# Patient Record
Sex: Male | Born: 1995 | Hispanic: Yes | Smoking: Former smoker
Health system: Southern US, Community
[De-identification: ages and names within clinical notes are randomized; demographics above are authoritative.]

## PROBLEM LIST (undated history)

## (undated) DIAGNOSIS — T7840XA Allergy, unspecified, initial encounter: Secondary | ICD-10-CM

## (undated) DIAGNOSIS — J45909 Unspecified asthma, uncomplicated: Secondary | ICD-10-CM

## (undated) DIAGNOSIS — F419 Anxiety disorder, unspecified: Secondary | ICD-10-CM

## (undated) DIAGNOSIS — R011 Cardiac murmur, unspecified: Secondary | ICD-10-CM

## (undated) DIAGNOSIS — F1910 Other psychoactive substance abuse, uncomplicated: Secondary | ICD-10-CM

## (undated) HISTORY — DX: Allergy, unspecified, initial encounter: T78.40XA

## (undated) HISTORY — DX: Cardiac murmur, unspecified: R01.1

---
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
## 2017-04-09 ENCOUNTER — Emergency Department
Admission: EM | Admit: 2017-04-09 | Discharge: 2017-04-09 | Disposition: A | Discharge status: Home / Self Care | Payer: Worker's Compensation | Attending: Emergency Medicine | Admitting: Emergency Medicine

## 2017-04-09 ENCOUNTER — Encounter: Payer: Self-pay | Admitting: Emergency Medicine

## 2017-04-09 ENCOUNTER — Emergency Department: Payer: Worker's Compensation

## 2017-04-09 ENCOUNTER — Other Ambulatory Visit: Payer: Self-pay

## 2017-04-09 DIAGNOSIS — Y990 Civilian activity done for income or pay: Secondary | ICD-10-CM | POA: Insufficient documentation

## 2017-04-09 DIAGNOSIS — Z79899 Other long term (current) drug therapy: Secondary | ICD-10-CM | POA: Insufficient documentation

## 2017-04-09 DIAGNOSIS — Y9259 Other trade areas as the place of occurrence of the external cause: Secondary | ICD-10-CM | POA: Insufficient documentation

## 2017-04-09 DIAGNOSIS — S61311A Laceration without foreign body of left index finger with damage to nail, initial encounter: Secondary | ICD-10-CM

## 2017-04-09 DIAGNOSIS — Y9389 Activity, other specified: Secondary | ICD-10-CM | POA: Insufficient documentation

## 2017-04-09 DIAGNOSIS — W260XXA Contact with knife, initial encounter: Secondary | ICD-10-CM | POA: Insufficient documentation

## 2017-04-09 DIAGNOSIS — F17210 Nicotine dependence, cigarettes, uncomplicated: Secondary | ICD-10-CM | POA: Insufficient documentation

## 2017-04-09 MED ORDER — LIDOCAINE HCL (PF) 1 % IJ SOLN
5.0000 mL | Freq: Once | INTRAMUSCULAR | Status: DC
  Filled 2017-04-09: qty 5

## 2017-04-09 MED ORDER — BACITRACIN ZINC 500 UNIT/GM EX OINT
1.0000 "application " | TOPICAL_OINTMENT | Freq: Once | CUTANEOUS | Status: DC
  Filled 2017-04-09: qty 0.9

## 2017-04-09 NOTE — ED Triage Notes (Signed)
Worker's comp - lac to left 2nd digit

## 2017-04-09 NOTE — Discharge Instructions (Signed)
Do not get the sutured area wet for 24 hours. After 24 hours, shower/bathe as usual and pat the area dry. Change the bandage 2 times per day and apply antibiotic ointment. Leave open to air when at no risk of getting the area dirty, but cover at night before bed.   

## 2017-04-09 NOTE — ED Provider Notes (Signed)
[facility] Emergency Department Provider Note  ____________________________________________  Time seen: Approximately 5:00 PM  I have reviewed the triage vital signs and the nursing notes.   HISTORY  Chief Complaint Extremity Laceration   HPI Brent Bowen is a 22 y.o. male who presents to the emergency department for evaluation and treatment of laceration to the left index finger that he sustained while at work.  Patient states that he was cutting a box of partially and the knife slipped.  He had recently sharp and the knife.  Tetanus vaccination is up-to-date.  Bleeding is well controlled with light pressure at this time.  No initial alleviating measures have been attempted for this complaint.  No past medical history on file.  There are no active problems to display for this patient.   No past surgical history on file.  Prior to Admission medications   Medication Sig Start Date End Date Taking? Authorizing Provider  albuterol (PROVENTIL HFA;VENTOLIN HFA) 108 (90 Base) MCG/ACT inhaler Inhale 2 puffs into the lungs every 6 (six) hours as needed for wheezing or shortness of breath. 11/29/15   Brent Bowen, Brent Eisenmenger B, FNP  amoxicillin-clavulanate (AUGMENTIN) 875-125 MG tablet Take 1 tablet by mouth 2 (two) times daily. 09/07/15   Hagler, Jami L, PA-C  guaiFENesin-codeine (ROBITUSSIN AC) 100-10 MG/5ML syrup Take 10 mLs by mouth 3 (three) times daily as needed for cough. 11/29/15   Brent Korf B, FNP  predniSONE (DELTASONE) 10 MG tablet Take 5 tablets (50 mg total) by mouth daily. 11/29/15   Brent Pester, FNP    Allergies Patient has no known allergies.  No family history on file.  Social History Social History   Tobacco Use  . Smoking status: Current Some Day Smoker    Packs/day: 0.15    Types: Cigarettes  . Smokeless tobacco: Never Used  Substance Use Topics  . Alcohol use: No  . Drug use: No    Review of Systems  Constitutional:  Negative for fever. Respiratory: Negative for cough or shortness of breath.  Musculoskeletal: Positive for myalgias Skin: Positive for laceration to the left index finger Neurological: Negative for numbness or paresthesias. ____________________________________________   PHYSICAL EXAM:  VITAL SIGNS: ED Triage Vitals  Enc Vitals Group     BP 04/09/17 1432 (!) 147/87     Pulse Rate 04/09/17 1432 72     Resp 04/09/17 1432 16     Temp 04/09/17 1432 98.4 F (36.9 C)     Temp Source 04/09/17 1432 Oral     SpO2 04/09/17 1432 100 %     Weight 04/09/17 1430 220 lb (99.8 kg)     Height 04/09/17 1430 6' (1.829 m)     Head Circumference --      Peak Flow --      Pain Score 04/09/17 1430 3     Pain Loc --      Pain Edu? --      Excl. in GC? --      Constitutional: Well appearing. Eyes: Conjunctivae are clear without discharge or drainage. Nose: No rhinorrhea noted. Mouth/Throat: Airway is patent.  Neck: No stridor. Unrestricted range of motion observed. Lymphatic: N/A Cardiovascular: Capillary refill is <3 seconds.  Respiratory: Respirations are even and unlabored.. Musculoskeletal: Unrestricted range of motion observed. Neurologic: Awake, alert, and oriented x 4.  Skin: 2 cm flap laceration noted to the medial aspect of the left index finger that crosses the upper edge of the nail.   ____________________________________________  LABS (all labs ordered are listed, but only abnormal results are displayed)  Labs Reviewed - No data to display ____________________________________________  EKG  Not indicated ____________________________________________  RADIOLOGY  Image of the left index finger reveals no acute bony abnormality and no metallic or radiopaque foreign body is identified. I, Brent Bowen, personally viewed and evaluated these images (plain radiographs) as part of my medical decision making, as well as reviewing the written report by the  radiologist.  ____________________________________________   PROCEDURES  .Marland Kitchen.Laceration Repair Date/Time: 04/09/2017 5:06 PM Performed by: Brent Bowen, Brent Massie B, FNP Authorized by: Brent Bowen, Brent Uher B, FNP   Consent:    Consent obtained:  Verbal   Consent given by:  Patient   Risks discussed:  Infection, pain and poor wound healing Anesthesia (see MAR for exact dosages):    Anesthesia method:  Nerve block   Block needle gauge:  25 G   Block anesthetic:  Lidocaine 1% w/o epi   Block technique:  Digital   Block injection procedure:  Anatomic landmarks identified and introduced needle   Block outcome:  Anesthesia achieved Laceration details:    Location:  Finger   Finger location:  Bowen index finger   Length (cm):  2 Repair type:    Repair type:  Simple Exploration:    Hemostasis achieved with:  Direct pressure   Wound exploration: entire depth of wound probed and visualized     Contaminated: no   Treatment:    Area cleansed with:  Saline and Betadine   Amount of cleaning:  Standard   Irrigation solution:  Sterile saline   Irrigation method:  Syringe   Visualized foreign bodies/material removed: yes   Skin repair:    Repair method:  Sutures   Suture size:  5-0   Suture technique:  Simple interrupted   Number of sutures:  6 Approximation:    Approximation:  Close   Vermilion border: well-aligned   Post-procedure details:    Dressing:  Sterile dressing and antibiotic ointment   Patient tolerance of procedure:  Tolerated well, no immediate complications Comments:     Small bits of parsley removed   ____________________________________________   INITIAL IMPRESSION / ASSESSMENT AND PLAN / ED COURSE  Brent Bowen is a 22 y.o. male who presents to the emergency department for evaluation and treatment of a laceration to the left index finger.  Sutures were inserted after digital block was completed. He was instructed to have the sutures removed in 10 days. Wound care  was also discussed. He is to return to the ER for symptoms that change or worsen or for new concerns if unable to schedule an appointment.  Medications  lidocaine (PF) (XYLOCAINE) 1 % injection 5 mL (not administered)  bacitracin ointment 1 application (not administered)     Pertinent labs & imaging results that were available during my care of the patient were reviewed by me and considered in my medical decision making (see chart for details). ____________________________________________   FINAL CLINICAL IMPRESSION(S) / ED DIAGNOSES  Final diagnoses:  Laceration of left index finger without foreign body with damage to nail, initial encounter    ED Discharge Orders    None       Note:  This document was prepared using Dragon voice recognition software and may include unintentional dictation errors.    Brent Bowen, Yevonne Yokum B, FNP 04/09/17 1709    Sharyn CreamerQuale, Mark, MD 04/09/17 2047

## 2017-04-09 NOTE — ED Notes (Signed)
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 Employee aware.

## 2017-07-24 ENCOUNTER — Other Ambulatory Visit: Payer: Self-pay | Admitting: Obstetrics and Gynecology

## 2017-07-24 DIAGNOSIS — Z31440 Encounter of male for testing for genetic disease carrier status for procreative management: Secondary | ICD-10-CM

## 2017-08-01 LAB — CYSTIC FIBROSIS MUTATION 97: Interpretation: NOT DETECTED

## 2017-10-29 IMAGING — DX DG FINGER MIDDLE 2+V*L*
3 series · 3 of 3 positions shown · non-contrast
Comparison: None.

CLINICAL DATA: 19-year-old male with injury to the left middle
finger.

EXAM:
LEFT MIDDLE FINGER 2+V

[finger ap]
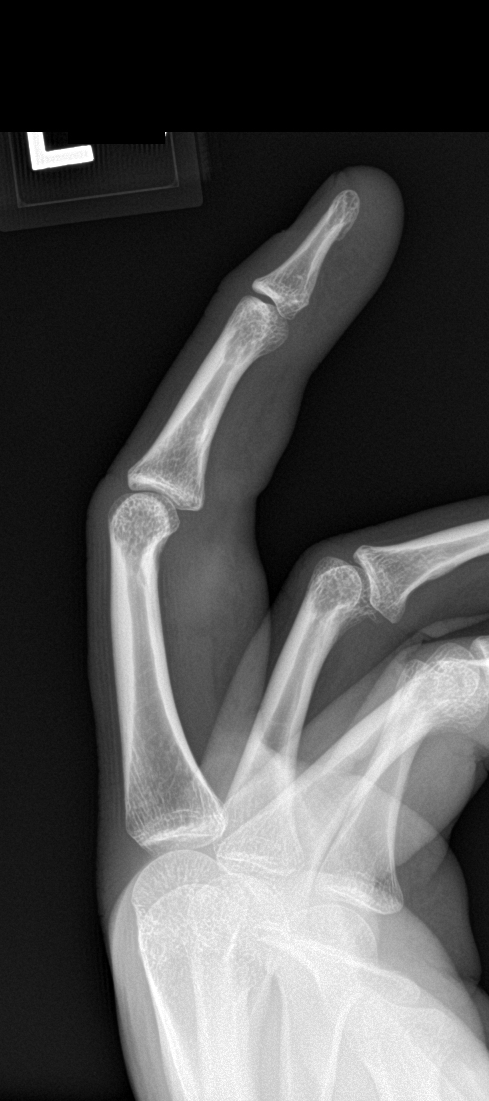

[finger obl]
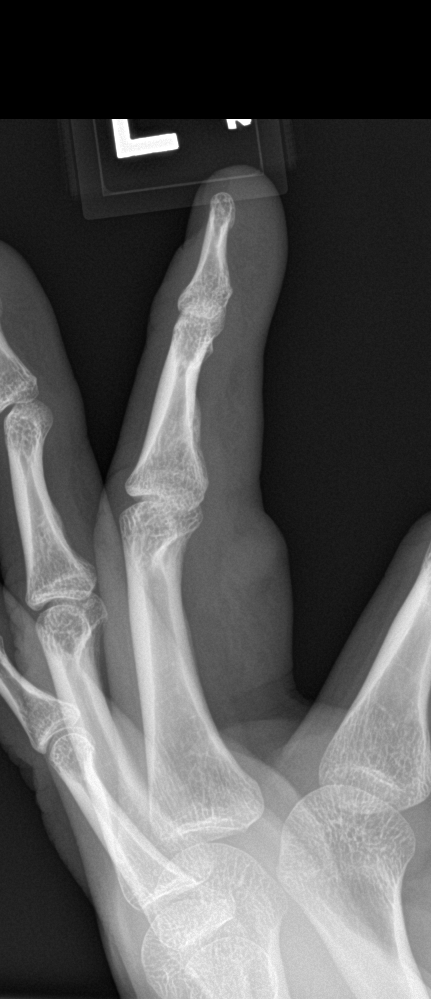

[finger lat]
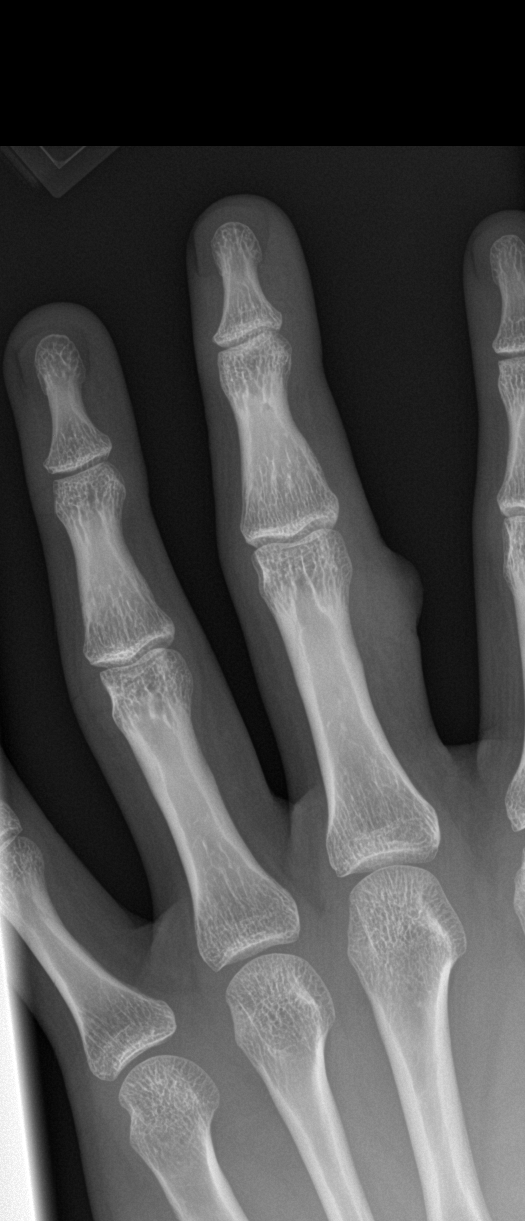

[3 of 3 positions shown; findings below may reference images not displayed]

FINDINGS: There is no acute fracture or dislocation. The bones are well
mineralized. There is diffuse soft tissue swelling of the middle
finger. A focal in soft tissue nodularity along the radial aspect of
the proximal phalanx of the middle finger may represent a focal area
skin laceration or infection/inflammation. Clinical correlation is
recommended. No radiopaque foreign object or soft tissue gas.
IMPRESSION: No acute/ traumatic osseous pathology.

Diffuse soft tissue swelling of the middle finger. Clinical
correlation is recommended.

## 2020-12-26 ENCOUNTER — Emergency Department
Admission: EM | Admit: 2020-12-26 | Discharge: 2020-12-26 | Disposition: A | Discharge status: Home / Self Care | Payer: Self-pay | Attending: Emergency Medicine | Admitting: Emergency Medicine

## 2020-12-26 ENCOUNTER — Emergency Department: Payer: Self-pay

## 2020-12-26 ENCOUNTER — Other Ambulatory Visit: Payer: Self-pay

## 2020-12-26 ENCOUNTER — Encounter: Payer: Self-pay | Admitting: Emergency Medicine

## 2020-12-26 DIAGNOSIS — J209 Acute bronchitis, unspecified: Secondary | ICD-10-CM | POA: Insufficient documentation

## 2020-12-26 DIAGNOSIS — Z20822 Contact with and (suspected) exposure to covid-19: Secondary | ICD-10-CM | POA: Insufficient documentation

## 2020-12-26 DIAGNOSIS — F17210 Nicotine dependence, cigarettes, uncomplicated: Secondary | ICD-10-CM | POA: Insufficient documentation

## 2020-12-26 DIAGNOSIS — J40 Bronchitis, not specified as acute or chronic: Secondary | ICD-10-CM

## 2020-12-26 LAB — CBC WITH DIFFERENTIAL/PLATELET
Abs Immature Granulocytes: 0.08 10*3/uL — ABNORMAL HIGH (ref 0.00–0.07)
Basophils Absolute: 0.1 10*3/uL (ref 0.0–0.1)
Basophils Relative: 1 %
Eosinophils Absolute: 0.9 10*3/uL — ABNORMAL HIGH (ref 0.0–0.5)
Eosinophils Relative: 9 %
HCT: 49 % (ref 39.0–52.0)
Hemoglobin: 16.5 g/dL (ref 13.0–17.0)
Immature Granulocytes: 1 %
Lymphocytes Relative: 33 %
Lymphs Abs: 3.1 10*3/uL (ref 0.7–4.0)
MCH: 28.6 pg (ref 26.0–34.0)
MCHC: 33.7 g/dL (ref 30.0–36.0)
MCV: 85.1 fL (ref 80.0–100.0)
Monocytes Absolute: 0.9 10*3/uL (ref 0.1–1.0)
Monocytes Relative: 9 %
Neutro Abs: 4.5 10*3/uL (ref 1.7–7.7)
Neutrophils Relative %: 47 %
Platelets: 340 10*3/uL (ref 150–400)
RBC: 5.76 MIL/uL (ref 4.22–5.81)
RDW: 12.6 % (ref 11.5–15.5)
WBC: 9.5 10*3/uL (ref 4.0–10.5)
nRBC: 0 % (ref 0.0–0.2)

## 2020-12-26 LAB — COMPREHENSIVE METABOLIC PANEL
ALT: 31 U/L (ref 0–44)
AST: 22 U/L (ref 15–41)
Albumin: 4.5 g/dL (ref 3.5–5.0)
Alkaline Phosphatase: 71 U/L (ref 38–126)
Anion gap: 9 (ref 5–15)
BUN: 12 mg/dL (ref 6–20)
CO2: 25 mmol/L (ref 22–32)
Calcium: 9.7 mg/dL (ref 8.9–10.3)
Chloride: 108 mmol/L (ref 98–111)
Creatinine, Ser: 1.02 mg/dL (ref 0.61–1.24)
GFR, Estimated: >60 mL/min (ref >60)
Glucose, Bld: 109 mg/dL — ABNORMAL HIGH (ref 70–99)
Potassium: 3.6 mmol/L (ref 3.5–5.1)
Sodium: 142 mmol/L (ref 135–145)
Total Bilirubin: 0.9 mg/dL (ref 0.3–1.2)
Total Protein: 8.1 g/dL (ref 6.5–8.1)

## 2020-12-26 LAB — RESP PANEL BY RT-PCR (FLU A&B, COVID) ARPGX2
Influenza A by PCR: NEGATIVE
Influenza B by PCR: NEGATIVE
SARS Coronavirus 2 by RT PCR: NEGATIVE

## 2020-12-26 LAB — D-DIMER, QUANTITATIVE: D-Dimer, Quant: 0.34 ug/mL-FEU (ref 0.00–0.50)

## 2020-12-26 MED ORDER — ALBUTEROL SULFATE HFA 108 (90 BASE) MCG/ACT IN AERS: 2.0000 | INHALATION_SPRAY | Freq: Four times a day (QID) | RESPIRATORY_TRACT | 2 refills | Status: DC | PRN

## 2020-12-26 MED ORDER — AZITHROMYCIN 250 MG PO TABS
ORAL_TABLET | ORAL | 0 refills | Status: AC
Start: 2020-12-26 — End: 2020-12-31

## 2020-12-26 MED ORDER — IPRATROPIUM-ALBUTEROL 0.5-2.5 (3) MG/3ML IN SOLN
3.0000 mL | Freq: Once | RESPIRATORY_TRACT | Status: AC
  Administered 2020-12-26: 3 mL via RESPIRATORY_TRACT
  Filled 2020-12-26: qty 3

## 2020-12-26 MED ORDER — ALBUTEROL SULFATE HFA 108 (90 BASE) MCG/ACT IN AERS
2.0000 | INHALATION_SPRAY | RESPIRATORY_TRACT | Status: DC | PRN
  Filled 2020-12-26 (×2): qty 6.7

## 2020-12-26 NOTE — Discharge Instructions (Addendum)
Take the Zithromax antibiotic as directed and use the inhaler 2 puffs 4 times a day as needed.  Please return if you are worse or no better in 3 or 4 days.  You can also follow-up with your regular doctor.

## 2020-12-26 NOTE — ED Provider Notes (Signed)
[facility] Emergency Department Provider Note   ____________________________________________   Event Date/Time   First MD Initiated Contact with Patient 12/26/20 0813     (approximate)  I have reviewed the triage vital signs and the nursing notes.   HISTORY  Chief Complaint Cough   HPI Brent Bowen is a 25 y.o. male with a cough and feeling short of breath for about a week. Cough produces little bit of mucus.  It did not look at the color.  He feels like he cannot get a deep breath.  He is not running a fever.  He had something like this in 2017 when he was diagnosed with bronchitis and got an inhaler.  He does not have any history of asthma.  He is not having any pain or chest discomfort.      History reviewed. No pertinent past medical history.  There are no problems to display for this patient.   History reviewed. No pertinent surgical history.  Prior to Admission medications   Medication Sig Start Date End Date Taking? Authorizing Provider  albuterol (VENTOLIN HFA) 108 (90 Base) MCG/ACT inhaler Inhale 2 puffs into the lungs every 6 (six) hours as needed for wheezing or shortness of breath. 12/26/20  Yes Arnaldo Natal, MD  azithromycin (ZITHROMAX Z-PAK) 250 MG tablet Take 2 tablets (500 mg) on  Day 1,  followed by 1 tablet (250 mg) once daily on Days 2 through 5. 12/26/20 12/31/20 Yes Arnaldo Natal, MD  albuterol (PROVENTIL HFA;VENTOLIN HFA) 108 (90 Base) MCG/ACT inhaler Inhale 2 puffs into the lungs every 6 (six) hours as needed for wheezing or shortness of breath. Patient not taking: Reported on 12/26/2020 11/29/15   Kem Boroughs B, FNP  amoxicillin-clavulanate (AUGMENTIN) 875-125 MG tablet Take 1 tablet by mouth 2 (two) times daily. Patient not taking: Reported on 12/26/2020 09/07/15   Hagler, Jami L, PA-C  guaiFENesin-codeine (ROBITUSSIN AC) 100-10 MG/5ML syrup Take 10 mLs by mouth 3 (three) times daily as needed for  cough. Patient not taking: Reported on 12/26/2020 11/29/15   Kem Boroughs B, FNP  predniSONE (DELTASONE) 10 MG tablet Take 5 tablets (50 mg total) by mouth daily. Patient not taking: Reported on 12/26/2020 11/29/15   Chinita Pester, FNP    Allergies Patient has no known allergies.  No family history on file.  Social History Social History   Tobacco Use   Smoking status: Some Days    Packs/day: 0.15    Types: Cigarettes   Smokeless tobacco: Never  Substance Use Topics   Alcohol use: No   Drug use: No    Review of Systems  Constitutional: No fever/he does complain of chills Eyes: No visual changes. ENT: No sore throat. Cardiovascular: Denies chest pain. Respiratory: shortness of breath. Gastrointestinal: No abdominal pain.  No nausea, no vomiting.  No diarrhea.  No constipation. Genitourinary: Negative for dysuria. Musculoskeletal: Negative for back pain. Skin: Negative for rash. Neurological: Negative for headaches, focal weakness   ____________________________________________   PHYSICAL EXAM:  VITAL SIGNS: ED Triage Vitals  Enc Vitals Group     BP 12/26/20 0756 138/90     Pulse Rate 12/26/20 0756 (!) 113     Resp 12/26/20 0756 (!) 22     Temp 12/26/20 0756 98.4 F (36.9 C)     Temp Source 12/26/20 0756 Oral     SpO2 12/26/20 0756 93 %     Weight 12/26/20 0756 230 lb (104.3 kg)  Height 12/26/20 0756 6' (1.829 m)     Head Circumference --      Peak Flow --      Pain Score 12/26/20 0759 8     Pain Loc --      Pain Edu? --      Excl. in GC? --     Constitutional: Alert and oriented. Well appearing and in no acute distress. Eyes: Conjunctivae are normal.  Head: Atraumatic. Nose: No congestion/rhinnorhea. Mouth/Throat: Mucous membranes are moist.  Oropharynx non-erythematous. Neck: No stridor.  Cardiovascular: Normal rate, regular rhythm. Grossly normal heart sounds.  Good peripheral circulation. Respiratory: Normal respiratory effort.  No  retractions. Lungs diffuse wheezing Gastrointestinal: Soft and nontender. No distention. No abdominal bruits.  Musculoskeletal: No lower extremity tenderness nor edema.   Neurologic:  Normal speech and language. No gross focal neurologic deficits are appreciated.  Skin:  Skin is warm, dry and intact. No rash noted.   ____________________________________________   LABS (all labs ordered are listed, but only abnormal results are displayed)  Labs Reviewed  CBC WITH DIFFERENTIAL/PLATELET - Abnormal; Notable for the following components:      Result Value   Eosinophils Absolute 0.9 (*)    Abs Immature Granulocytes 0.08 (*)    All other components within normal limits  COMPREHENSIVE METABOLIC PANEL - Abnormal; Notable for the following components:   Glucose, Bld 109 (*)    All other components within normal limits  RESP PANEL BY RT-PCR (FLU A&B, COVID) [id number]  D-DIMER, QUANTITATIVE   ____________________________________________  EKG  EKG read interpreted by me shows slight right axis ____________________________________________  RADIOLOGY Jill Poling, personally viewed and evaluated these images (plain radiographs) as part of my medical decision making, as well as reviewing the written report by the radiologist.  ED MD interpretation:    Official radiology report(s): DG Chest 2 View  Result Date: 12/26/2020 CLINICAL DATA:  25 year old male with cough. Chest tightness and chills for 1 week. Smoker. EXAM: CHEST - 2 VIEW COMPARISON:  Chest radiographs 11/29/2015. FINDINGS: Lung volumes and mediastinal contours remain normal. Visualized tracheal air column is within normal limits. Stable lung markings since 2017, mild asymmetrically increased in the right upper lobe. No pneumothorax, pulmonary edema, pleural effusion, confluent or acute pulmonary opacity. No acute osseous abnormality identified. Negative visible bowel gas. IMPRESSION: No acute cardiopulmonary abnormality.  Electronically Signed   By: Odessa Fleming M.D.   On: 12/26/2020 09:15    ____________________________________________   PROCEDURES  Procedure(s) performed (including Critical Care):  Procedures   ____________________________________________   INITIAL IMPRESSION / ASSESSMENT AND PLAN / ED COURSE    ----------------------------------------- 12:04 PM on 12/26/2020 ----------------------------------------- Patient feels much better.  Lung wheezing is stopped he still has a few scattered crackles.  O2 sats are 97 on room air.  I will discharge him.  Sounds like he has bronchitis.  There is no sign of DVT PE pneumonia etc.  COVID is negative.          ____________________________________________   FINAL CLINICAL IMPRESSION(S) / ED DIAGNOSES  Final diagnoses:  Bronchitis     ED Discharge Orders          Ordered    azithromycin (ZITHROMAX Z-PAK) 250 MG tablet        12/26/20 1204    albuterol (VENTOLIN HFA) 108 (90 Base) MCG/ACT inhaler  Every 6 hours PRN        12/26/20 1204  Note:  This document was prepared using Dragon voice recognition software and may include unintentional dictation errors.    Arnaldo Natal, MD 12/26/20 [phone]

## 2020-12-26 NOTE — ED Triage Notes (Addendum)
C/O cough, chills x 1 week.  Symptoms not improving.  States feels like he cannot get a full breath.    AAOx3.  Skin pale.  Cough present.  No SOB/ DOE., tachypnea noted.  Voice clear and strong.

## 2021-05-14 DIAGNOSIS — F411 Generalized anxiety disorder: Secondary | ICD-10-CM | POA: Insufficient documentation

## 2021-05-14 DIAGNOSIS — J4520 Mild intermittent asthma, uncomplicated: Secondary | ICD-10-CM | POA: Insufficient documentation

## 2021-05-14 DIAGNOSIS — R030 Elevated blood-pressure reading, without diagnosis of hypertension: Secondary | ICD-10-CM | POA: Diagnosis present

## 2021-05-24 DIAGNOSIS — R7303 Prediabetes: Secondary | ICD-10-CM | POA: Diagnosis present

## 2021-12-01 ENCOUNTER — Ambulatory Visit
Admission: EM | Admit: 2021-12-01 | Discharge: 2021-12-01 | Disposition: A | Discharge status: Home / Self Care | Payer: BC Managed Care – PPO | Attending: Emergency Medicine | Admitting: Emergency Medicine

## 2021-12-01 ENCOUNTER — Encounter: Payer: Self-pay | Admitting: Emergency Medicine

## 2021-12-01 ENCOUNTER — Ambulatory Visit (INDEPENDENT_AMBULATORY_CARE_PROVIDER_SITE_OTHER): Payer: BC Managed Care – PPO

## 2021-12-01 DIAGNOSIS — N50812 Left testicular pain: Secondary | ICD-10-CM

## 2021-12-01 DIAGNOSIS — S3994XA Unspecified injury of external genitals, initial encounter: Secondary | ICD-10-CM | POA: Diagnosis not present

## 2021-12-01 DIAGNOSIS — N5089 Other specified disorders of the male genital organs: Secondary | ICD-10-CM

## 2021-12-01 DIAGNOSIS — N433 Hydrocele, unspecified: Secondary | ICD-10-CM

## 2021-12-01 HISTORY — DX: Anxiety disorder, unspecified: F41.9

## 2021-12-01 MED ORDER — KETOROLAC TROMETHAMINE 10 MG PO TABS: 10.0000 mg | ORAL_TABLET | Freq: Four times a day (QID) | ORAL | 0 refills | Status: DC | PRN

## 2021-12-01 MED ORDER — KETOROLAC TROMETHAMINE 60 MG/2ML IM SOLN
30.0000 mg | Freq: Once | INTRAMUSCULAR | Status: AC
  Administered 2021-12-01: 30 mg via INTRAMUSCULAR

## 2021-12-01 NOTE — Discharge Instructions (Addendum)
Your ultrasound today did not demonstrate any damage to your testicle or disruption of blood flow.  It did demonstrate that there is some fluid surrounding your testicle in your scrotum that is most likely the result of the trauma you sustained.  You need to wear supportive underwear to take pressure off of your scrotum.  You may also wear an athletic supporter (jockstrap) to achieve the same effect.  Ice your scrotum for 20 minutes at a time 2-3 times a day to help with pain and inflammation.  Make sure that there is a cloth between your skin and the ice so as not to cause frostbite or skin damage.  I am going to give you a prescription for Toradol that you can use for pain.  You may also supplement with over-the-counter Tylenol.  I am referring you to [facility] urology so they can monitor the resolution of the fluid in your scrotum.  If you have any increase in swelling, pain, fever, or blood in your urine you need to go to the ER for evaluation.
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
## 2021-12-14 ENCOUNTER — Ambulatory Visit: Payer: BC Managed Care – PPO | Admitting: Urology

## 2021-12-16 ENCOUNTER — Encounter: Payer: Self-pay | Admitting: Urology

## 2023-01-31 ENCOUNTER — Other Ambulatory Visit: Payer: Self-pay

## 2023-01-31 ENCOUNTER — Emergency Department
Admission: EM | Admit: 2023-01-31 | Discharge: 2023-02-01 | Disposition: A | Discharge status: Home / Self Care | Payer: BC Managed Care – PPO | Attending: Emergency Medicine | Admitting: Emergency Medicine

## 2023-01-31 ENCOUNTER — Emergency Department: Payer: BC Managed Care – PPO

## 2023-01-31 DIAGNOSIS — Z1152 Encounter for screening for COVID-19: Secondary | ICD-10-CM | POA: Diagnosis not present

## 2023-01-31 DIAGNOSIS — R0602 Shortness of breath: Secondary | ICD-10-CM

## 2023-01-31 DIAGNOSIS — J40 Bronchitis, not specified as acute or chronic: Secondary | ICD-10-CM | POA: Diagnosis not present

## 2023-01-31 LAB — RESP PANEL BY RT-PCR (RSV, FLU A&B, COVID)  RVPGX2
Influenza A by PCR: NEGATIVE
Influenza B by PCR: NEGATIVE
Resp Syncytial Virus by PCR: NEGATIVE
SARS Coronavirus 2 by RT PCR: NEGATIVE

## 2023-01-31 LAB — COMPREHENSIVE METABOLIC PANEL
ALT: 38 U/L (ref 0–44)
AST: 23 U/L (ref 15–41)
Albumin: 4.6 g/dL (ref 3.5–5.0)
Alkaline Phosphatase: 61 U/L (ref 38–126)
Anion gap: 8 (ref 5–15)
BUN: 9 mg/dL (ref 6–20)
CO2: 21 mmol/L — ABNORMAL LOW (ref 22–32)
Calcium: 9 mg/dL (ref 8.9–10.3)
Chloride: 107 mmol/L (ref 98–111)
Creatinine, Ser: 0.95 mg/dL (ref 0.61–1.24)
GFR, Estimated: >60 mL/min (ref >60)
Glucose, Bld: 104 mg/dL — ABNORMAL HIGH (ref 70–99)
Potassium: 3.2 mmol/L — ABNORMAL LOW (ref 3.5–5.1)
Sodium: 136 mmol/L (ref 135–145)
Total Bilirubin: 0.5 mg/dL (ref <1.2)
Total Protein: 7.7 g/dL (ref 6.5–8.1)

## 2023-01-31 LAB — CBC
HCT: 43.3 % (ref 39.0–52.0)
Hemoglobin: 14.7 g/dL (ref 13.0–17.0)
MCH: 28.8 pg (ref 26.0–34.0)
MCHC: 33.9 g/dL (ref 30.0–36.0)
MCV: 84.9 fL (ref 80.0–100.0)
Platelets: 361 10*3/uL (ref 150–400)
RBC: 5.1 MIL/uL (ref 4.22–5.81)
RDW: 12.2 % (ref 11.5–15.5)
WBC: 13.4 10*3/uL — ABNORMAL HIGH (ref 4.0–10.5)
nRBC: 0 % (ref 0.0–0.2)

## 2023-01-31 LAB — TROPONIN I (HIGH SENSITIVITY): Troponin I (High Sensitivity): 2 ng/L (ref <18)

## 2023-01-31 NOTE — ED Triage Notes (Signed)
Pt presents to ER with c/o SOB and chest tightness that started this morning.  Pt reports hx of asthma, but does not have inhaler at home.  Pt reports he got a Primatene inhaler at a pharmacy, and states his sx were worse after that.  Pt also states his throat feels scratchy at this time.  Pt is otherwise A&O x4 and in NAD at this time.

## 2023-01-31 NOTE — ED Provider Notes (Signed)
[facility] Provider Note    Event Date/Time   First MD Initiated Contact with Patient 01/31/23 2336     (approximate)   History   Shortness of Breath   HPI  Brent Bowen is a 27 y.o. male who presented to the emergency department today because of concerns for shortness of breath and chest pain.  Yesterday the patient first noticed some shortness of breath.  However when he woke up this morning it was more pronounced.  It was accompanied by sharp central chest pain.  Patient states that he had a similar episode a couple of years ago.  He did try getting an over the counter inhaler from [facility] which did not give any significant relief.  He denies any fevers or chills.     Physical Exam   Triage Vital Signs: ED Triage Vitals  Encounter Vitals Group     BP 01/31/23 2206 (!) 151/104     Systolic BP Percentile --      Diastolic BP Percentile --      Pulse Rate 01/31/23 2206 92     Resp 01/31/23 2206 (!) 22     Temp 01/31/23 2206 98.8 F (37.1 C)     Temp Source 01/31/23 2206 Oral     SpO2 01/31/23 2206 94 %     Weight 01/31/23 2204 235 lb (106.6 kg)     Height 01/31/23 2204 6\' 1"  (1.854 m)     Head Circumference --      Peak Flow --      Pain Score 01/31/23 2203 8     Pain Loc --      Pain Education --      Exclude from Growth Chart --     Most recent vital signs: Vitals:   01/31/23 2206  BP: (!) 151/104  Pulse: 92  Resp: (!) 22  Temp: 98.8 F (37.1 C)  SpO2: 94%   General: Awake, alert, oriented. CV:  Good peripheral perfusion. Regular rate and rhythm. Resp:  Normal effort. Diffuse expiratory wheezing. Abd:  No distention.   ED Results / Procedures / Treatments   Labs (all labs ordered are listed, but only abnormal results are displayed) Labs Reviewed  CBC - Abnormal; Notable for the following components:      Result Value   WBC 13.4 (*)    All other components within normal limits  COMPREHENSIVE METABOLIC PANEL  - Abnormal; Notable for the following components:   Potassium 3.2 (*)    CO2 21 (*)    Glucose, Bld 104 (*)    All other components within normal limits  RESP PANEL BY RT-PCR (RSV, FLU A&B, COVID)  RVPGX2  TROPONIN I (HIGH SENSITIVITY)  TROPONIN I (HIGH SENSITIVITY)     EKG  I, Phineas Semen, attending physician, personally viewed and interpreted this EKG  EKG Time: 2203 Rate: 93 Rhythm: normal sinus rhythm Axis: normal Intervals: qtc 427 QRS: narrow ST changes: no st elevation Impression: normal ekg   RADIOLOGY I independently interpreted and visualized the CXR. My interpretation: No pneumina Radiology interpretation:  IMPRESSION:  No active cardiopulmonary disease.      PROCEDURES:  Critical Care performed: No   MEDICATIONS ORDERED IN ED: Medications - No data to display   IMPRESSION / MDM / ASSESSMENT AND PLAN / ED COURSE  I reviewed the triage vital signs and the nursing notes.  Differential diagnosis includes, but is not limited to, pneumonia, bronchitis, viral illness  Patient's presentation is most consistent with acute presentation with potential threat to life or bodily function.  Patient presented to the emergency department today because of concerns for shortness of breath and chest pain.  EKG without concerning abnormalities.  Chest x-ray without pneumonia or pneumothorax.  On exam patient has diffuse expiratory wheezing.  Will try giving steroids and breathing treatment here in the emergency department.  If patient feels improvement I do think would be reasonable to discharge with prescription for further steroids and inhaler.     FINAL CLINICAL IMPRESSION(S) / ED DIAGNOSES   Final diagnoses:  Bronchitis  SOB (shortness of breath)    Note:  This document was prepared using Dragon voice recognition software and may include unintentional dictation errors.    Phineas Semen, MD 02/01/23 [phone]

## 2023-01-31 NOTE — ED Provider Notes (Incomplete)
[facility] Provider Note    Event Date/Time   First MD Initiated Contact with Patient 01/31/23 2336     (approximate)   History   Shortness of Breath   HPI {Remember to add pertinent medical, surgical, social, and/or OB history to HPI:1} Brent Bowen is a 27 y.o. male  ***       Physical Exam   Triage Vital Signs: ED Triage Vitals  Encounter Vitals Group     BP 01/31/23 2206 (!) 151/104     Systolic BP Percentile --      Diastolic BP Percentile --      Pulse Rate 01/31/23 2206 92     Resp 01/31/23 2206 (!) 22     Temp 01/31/23 2206 98.8 F (37.1 C)     Temp Source 01/31/23 2206 Oral     SpO2 01/31/23 2206 94 %     Weight 01/31/23 2204 235 lb (106.6 kg)     Height 01/31/23 2204 6\' 1"  (1.854 m)     Head Circumference --      Peak Flow --      Pain Score 01/31/23 2203 8     Pain Loc --      Pain Education --      Exclude from Growth Chart --     Most recent vital signs: Vitals:   01/31/23 2206  BP: (!) 151/104  Pulse: 92  Resp: (!) 22  Temp: 98.8 F (37.1 C)  SpO2: 94%    {Only need to document appropriate and relevant physical exam:1} General: Awake, no distress. *** CV:  Good peripheral perfusion. *** Resp:  Normal effort. *** Abd:  No distention. *** Other:  ***   ED Results / Procedures / Treatments   Labs (all labs ordered are listed, but only abnormal results are displayed) Labs Reviewed  CBC - Abnormal; Notable for the following components:      Result Value   WBC 13.4 (*)    All other components within normal limits  COMPREHENSIVE METABOLIC PANEL - Abnormal; Notable for the following components:   Potassium 3.2 (*)    CO2 21 (*)    Glucose, Bld 104 (*)    All other components within normal limits  RESP PANEL BY RT-PCR (RSV, FLU A&B, COVID)  RVPGX2  TROPONIN I (HIGH SENSITIVITY)  TROPONIN I (HIGH SENSITIVITY)     EKG  I, Phineas Semen, attending physician, personally viewed and  interpreted this EKG  EKG Time: 2203 Rate: 93 Rhythm: normal sinus rhythm Axis: normal Intervals: qtc 427 QRS: narrow ST changes: no st elevation Impression: normal ekg   RADIOLOGY I independently interpreted and visualized the CXR. My interpretation: No pneumina Radiology interpretation:  IMPRESSION:  No active cardiopulmonary disease.      PROCEDURES:  Critical Care performed: Yes  CRITICAL CARE Performed by: Phineas Semen   Total critical care time: *** minutes  Critical care time was exclusive of separately billable procedures and treating other patients.  Critical care was necessary to treat or prevent imminent or life-threatening deterioration.  Critical care was time spent personally by me on the following activities: development of treatment plan with patient and/or surrogate as well as nursing, discussions with consultants, evaluation of patient's response to treatment, examination of patient, obtaining history from patient or surrogate, ordering and performing treatments and interventions, ordering and review of laboratory studies, ordering and review of radiographic studies, pulse oximetry and re-evaluation of patient's condition.   Procedures  MEDICATIONS ORDERED IN ED: Medications - No data to display   IMPRESSION / MDM / ASSESSMENT AND PLAN / ED COURSE  I reviewed the triage vital signs and the nursing notes.                              Differential diagnosis includes, but is not limited to, ***  Patient's presentation is most consistent with {EM COPA:27473}   ***The patient is on the cardiac monitor to evaluate for evidence of arrhythmia and/or significant heart rate changes.  ***      FINAL CLINICAL IMPRESSION(S) / ED DIAGNOSES   Final diagnoses:  None     Rx / DC Orders   ED Discharge Orders     None        Note:  This document was prepared using Dragon voice recognition software and may include unintentional  dictation errors.

## 2023-02-01 MED ORDER — ALBUTEROL SULFATE HFA 108 (90 BASE) MCG/ACT IN AERS: 2.0000 | INHALATION_SPRAY | Freq: Four times a day (QID) | RESPIRATORY_TRACT | 2 refills | Status: DC | PRN

## 2023-02-01 MED ORDER — PREDNISONE 20 MG PO TABS: 40.0000 mg | ORAL_TABLET | Freq: Every day | ORAL | 0 refills | Status: AC

## 2023-02-01 MED ORDER — PREDNISONE 20 MG PO TABS
40.0000 mg | ORAL_TABLET | Freq: Once | ORAL | Status: AC
  Administered 2023-02-01: 40 mg via ORAL
  Filled 2023-02-01: qty 2

## 2023-02-01 MED ORDER — IPRATROPIUM-ALBUTEROL 0.5-2.5 (3) MG/3ML IN SOLN
3.0000 mL | Freq: Once | RESPIRATORY_TRACT | Status: AC
  Administered 2023-02-01: 3 mL via RESPIRATORY_TRACT
  Filled 2023-02-01: qty 3

## 2023-02-01 NOTE — Discharge Instructions (Signed)
Please seek medical attention for any high fevers, chest pain, shortness of breath, change in behavior, persistent vomiting, bloody stool or any other new or concerning symptoms.  

## 2023-05-21 ENCOUNTER — Encounter: Payer: Self-pay | Admitting: Intensive Care

## 2023-05-21 ENCOUNTER — Other Ambulatory Visit: Payer: Self-pay

## 2023-05-21 ENCOUNTER — Inpatient Hospital Stay
Admission: EM | Admit: 2023-05-21 | Discharge: 2023-05-23 | DRG: 203 | Disposition: A | Discharge status: Home / Self Care | Payer: Self-pay | Attending: Internal Medicine | Admitting: Internal Medicine

## 2023-05-21 ENCOUNTER — Emergency Department: Payer: Self-pay

## 2023-05-21 DIAGNOSIS — J45901 Unspecified asthma with (acute) exacerbation: Principal | ICD-10-CM | POA: Diagnosis present

## 2023-05-21 DIAGNOSIS — J4521 Mild intermittent asthma with (acute) exacerbation: Principal | ICD-10-CM

## 2023-05-21 DIAGNOSIS — R7303 Prediabetes: Secondary | ICD-10-CM

## 2023-05-21 DIAGNOSIS — F17210 Nicotine dependence, cigarettes, uncomplicated: Secondary | ICD-10-CM | POA: Diagnosis present

## 2023-05-21 DIAGNOSIS — R000 Tachycardia, unspecified: Secondary | ICD-10-CM

## 2023-05-21 DIAGNOSIS — R0603 Acute respiratory distress: Secondary | ICD-10-CM | POA: Diagnosis present

## 2023-05-21 DIAGNOSIS — Z79899 Other long term (current) drug therapy: Secondary | ICD-10-CM

## 2023-05-21 DIAGNOSIS — F419 Anxiety disorder, unspecified: Secondary | ICD-10-CM | POA: Diagnosis present

## 2023-05-21 DIAGNOSIS — Z1152 Encounter for screening for COVID-19: Secondary | ICD-10-CM

## 2023-05-21 DIAGNOSIS — Z5971 Insufficient health insurance coverage: Secondary | ICD-10-CM

## 2023-05-21 DIAGNOSIS — Z7951 Long term (current) use of inhaled steroids: Secondary | ICD-10-CM

## 2023-05-21 DIAGNOSIS — R030 Elevated blood-pressure reading, without diagnosis of hypertension: Secondary | ICD-10-CM

## 2023-05-21 HISTORY — DX: Unspecified asthma, uncomplicated: J45.909

## 2023-05-21 HISTORY — DX: Other psychoactive substance abuse, uncomplicated: F19.10

## 2023-05-21 LAB — RESPIRATORY PANEL BY PCR

## 2023-05-21 LAB — COMPREHENSIVE METABOLIC PANEL
ALT: 42 U/L (ref 0–44)
AST: 29 U/L (ref 15–41)
Albumin: 4 g/dL (ref 3.5–5.0)
Alkaline Phosphatase: 68 U/L (ref 38–126)
Anion gap: 7 (ref 5–15)
BUN: 8 mg/dL (ref 6–20)
CO2: 22 mmol/L (ref 22–32)
Calcium: 8.3 mg/dL — ABNORMAL LOW (ref 8.9–10.3)
Chloride: 107 mmol/L (ref 98–111)
Creatinine, Ser: 0.96 mg/dL (ref 0.61–1.24)
GFR, Estimated: >60 mL/min (ref >60)
Glucose, Bld: 128 mg/dL — ABNORMAL HIGH (ref 70–99)
Potassium: 3.1 mmol/L — ABNORMAL LOW (ref 3.5–5.1)
Sodium: 136 mmol/L (ref 135–145)
Total Bilirubin: 0.8 mg/dL (ref 0.0–1.2)
Total Protein: 7.5 g/dL (ref 6.5–8.1)

## 2023-05-21 LAB — RESP PANEL BY RT-PCR (RSV, FLU A&B, COVID)  RVPGX2
Influenza A by PCR: NEGATIVE
Influenza B by PCR: NEGATIVE
Resp Syncytial Virus by PCR: NEGATIVE
SARS Coronavirus 2 by RT PCR: NEGATIVE

## 2023-05-21 LAB — TSH: TSH: 1.002 u[IU]/mL (ref 0.350–4.500)

## 2023-05-21 LAB — CBC WITH DIFFERENTIAL/PLATELET
Abs Immature Granulocytes: 0.04 10*3/uL (ref 0.00–0.07)
Basophils Absolute: 0.1 10*3/uL (ref 0.0–0.1)
Basophils Relative: 1 %
Eosinophils Absolute: 0.7 10*3/uL — ABNORMAL HIGH (ref 0.0–0.5)
Eosinophils Relative: 7 %
HCT: 45.5 % (ref 39.0–52.0)
Hemoglobin: 15.1 g/dL (ref 13.0–17.0)
Immature Granulocytes: 0 %
Lymphocytes Relative: 23 %
Lymphs Abs: 2.4 10*3/uL (ref 0.7–4.0)
MCH: 28.8 pg (ref 26.0–34.0)
MCHC: 33.2 g/dL (ref 30.0–36.0)
MCV: 86.7 fL (ref 80.0–100.0)
Monocytes Absolute: 0.8 10*3/uL (ref 0.1–1.0)
Monocytes Relative: 8 %
Neutro Abs: 6.3 10*3/uL (ref 1.7–7.7)
Neutrophils Relative %: 61 %
Platelets: 348 10*3/uL (ref 150–400)
RBC: 5.25 MIL/uL (ref 4.22–5.81)
RDW: 12.5 % (ref 11.5–15.5)
WBC: 10.2 10*3/uL (ref 4.0–10.5)
nRBC: 0 % (ref 0.0–0.2)

## 2023-05-21 LAB — HEMOGLOBIN A1C
Hgb A1c MFr Bld: 5.5 % (ref 4.8–5.6)
Mean Plasma Glucose: 111.15 mg/dL

## 2023-05-21 LAB — D-DIMER, QUANTITATIVE: D-Dimer, Quant: <0.27 ug{FEU}/mL (ref 0.00–0.50)

## 2023-05-21 LAB — PROCALCITONIN: Procalcitonin: <0.1 ng/mL

## 2023-05-21 MED ORDER — ONDANSETRON HCL 4 MG PO TABS: 4.0000 mg | ORAL_TABLET | Freq: Four times a day (QID) | ORAL | Status: DC | PRN

## 2023-05-21 MED ORDER — PREDNISONE 20 MG PO TABS: 60.0000 mg | ORAL_TABLET | Freq: Every day | ORAL | Status: DC

## 2023-05-21 MED ORDER — LORAZEPAM 2 MG/ML IJ SOLN
0.5000 mg | Freq: Once | INTRAMUSCULAR | Status: AC
  Administered 2023-05-21: 0.5 mg via INTRAVENOUS
  Filled 2023-05-21: qty 1

## 2023-05-21 MED ORDER — IPRATROPIUM-ALBUTEROL 0.5-2.5 (3) MG/3ML IN SOLN
3.0000 mL | Freq: Once | RESPIRATORY_TRACT | Status: AC
  Administered 2023-05-21: 3 mL via RESPIRATORY_TRACT
  Filled 2023-05-21: qty 3

## 2023-05-21 MED ORDER — ACETAMINOPHEN 325 MG PO TABS: 650.0000 mg | ORAL_TABLET | Freq: Four times a day (QID) | ORAL | Status: DC | PRN

## 2023-05-21 MED ORDER — IPRATROPIUM-ALBUTEROL 0.5-2.5 (3) MG/3ML IN SOLN
3.0000 mL | Freq: Four times a day (QID) | RESPIRATORY_TRACT | Status: DC
  Administered 2023-05-21 (×3): 3 mL via RESPIRATORY_TRACT
  Filled 2023-05-21 (×2): qty 3

## 2023-05-21 MED ORDER — HYDROCOD POLI-CHLORPHE POLI ER 10-8 MG/5ML PO SUER
5.0000 mL | Freq: Two times a day (BID) | ORAL | Status: DC | PRN
  Administered 2023-05-22: 5 mL via ORAL
  Filled 2023-05-21: qty 5

## 2023-05-21 MED ORDER — PREDNISONE 20 MG PO TABS
60.0000 mg | ORAL_TABLET | Freq: Once | ORAL | Status: AC
  Administered 2023-05-21: 60 mg via ORAL
  Filled 2023-05-21: qty 3

## 2023-05-21 MED ORDER — GUAIFENESIN ER 600 MG PO TB12
600.0000 mg | ORAL_TABLET | Freq: Two times a day (BID) | ORAL | Status: DC
  Administered 2023-05-21 – 2023-05-23 (×5): 600 mg via ORAL
  Filled 2023-05-21 (×5): qty 1

## 2023-05-21 MED ORDER — ENOXAPARIN SODIUM 60 MG/0.6ML IJ SOSY
50.0000 mg | PREFILLED_SYRINGE | INTRAMUSCULAR | Status: DC
  Administered 2023-05-21 – 2023-05-22 (×2): 50 mg via SUBCUTANEOUS
  Filled 2023-05-21 (×2): qty 0.6

## 2023-05-21 MED ORDER — BUDESONIDE 0.5 MG/2ML IN SUSP
0.5000 mg | Freq: Two times a day (BID) | RESPIRATORY_TRACT | Status: DC
  Administered 2023-05-21 – 2023-05-23 (×4): 0.5 mg via RESPIRATORY_TRACT
  Filled 2023-05-21 (×4): qty 2

## 2023-05-21 MED ORDER — ONDANSETRON HCL 4 MG/2ML IJ SOLN: 4.0000 mg | Freq: Four times a day (QID) | INTRAMUSCULAR | Status: DC | PRN

## 2023-05-21 MED ORDER — POLYETHYLENE GLYCOL 3350 17 G PO PACK: 17.0000 g | PACK | Freq: Every day | ORAL | Status: DC | PRN

## 2023-05-21 MED ORDER — SODIUM CHLORIDE 0.9% FLUSH
3.0000 mL | Freq: Two times a day (BID) | INTRAVENOUS | Status: DC
  Administered 2023-05-21 – 2023-05-23 (×5): 3 mL via INTRAVENOUS

## 2023-05-21 MED ORDER — ACETAMINOPHEN 650 MG RE SUPP
650.0000 mg | Freq: Four times a day (QID) | RECTAL | Status: DC | PRN
Start: 2023-05-21 — End: 2023-05-23

## 2023-05-21 MED ORDER — SODIUM CHLORIDE 0.9 % IV BOLUS
1000.0000 mL | Freq: Once | INTRAVENOUS | Status: AC
  Administered 2023-05-21: 1000 mL via INTRAVENOUS

## 2023-05-21 MED ORDER — POTASSIUM CHLORIDE CRYS ER 20 MEQ PO TBCR
40.0000 meq | EXTENDED_RELEASE_TABLET | Freq: Once | ORAL | Status: AC
  Administered 2023-05-21: 40 meq via ORAL
  Filled 2023-05-21: qty 2

## 2023-05-21 NOTE — ED Notes (Signed)
 Messaged MD Huel Cote about patients complaints of shortness of breath and stating "I feel like I can't breath".  Verbal order to give Douneb.

## 2023-05-21 NOTE — Assessment & Plan Note (Signed)
 Likely reactive in the setting of respiratory distress and multiple DuoNeb treatments.  - Telemetry monitoring overnight

## 2023-05-21 NOTE — Progress Notes (Signed)
 Docia Furl NP notified SVT HR [id number]

## 2023-05-21 NOTE — Progress Notes (Signed)
 Rapid response called.

## 2023-05-21 NOTE — ED Triage Notes (Signed)
 Patient c/o difficulty breathing X2 days. C/o congestion X2 days

## 2023-05-21 NOTE — Assessment & Plan Note (Signed)
 Significantly hypertensive on exam, however likely related to respiratory issues.  If remains hypertensive prior to discharge, can consider antihypertensives versus close PCP follow-up

## 2023-05-21 NOTE — ED Notes (Signed)
 See triage note  Presents with a 2 day hx of SOB and congestion Denies any fever  Noticed some wheezing and uses an OTC inhaler as needed

## 2023-05-21 NOTE — Assessment & Plan Note (Signed)
 No A1c available in chart.  - A1c pending

## 2023-05-21 NOTE — H&P (Signed)
 History and Physical    Patient: Brent Bowen BJS:[id number] DOB: 11-04-1995 DOA: 05/21/2023 DOS: the patient was seen and examined on 05/21/2023 PCP: [facility]  Patient coming from: Home  Chief Complaint:  Chief Complaint  Patient presents with   Shortness of Breath   HPI: Brent Bowen is a 28 y.o. male with medical history significant of asthma who presents to the ED due to shortness of breath.  Brent Bowen states he has been experiencing 2-3 days of a productive cough, wheezing and shortness of breath.  He ran out of his home inhalers and bought an over-the-counter inhaler at [facility] that has not been helping with his symptoms.  He denies any fever, chills, nausea, vomiting, diarrhea, abdominal pain, chest pain.  ED course: On arrival to the ED, patient was hypertensive at 154/105 with heart rate of 115.  He was saturating at 92% on room air.  He was afebrile at 98.4. Initial workup notable for unremarkable CBC and CMP with potassium of 3.1.  COVID-19, influenza and RSV PCR negative.  Chest x-ray with no active disease.  Given persistent respiratory distress after 4 DuoNeb treatments and prednisone, [facility] contacted for admission.  Review of Systems: As mentioned in the history of present illness. All other systems reviewed and are negative.  Past Medical History:  Diagnosis Date   Anxiety    History reviewed. No pertinent surgical history.  Social History:  reports that he has been smoking cigarettes. He has never used smokeless tobacco. He reports current alcohol use. He reports current drug use. Drug: Marijuana.  No Known Allergies  History reviewed. No pertinent family history.  Prior to Admission medications   Medication Sig Start Date End Date Taking? Authorizing Provider  fluticasone-salmeterol (ADVAIR) 100-50 MCG/ACT AEPB FLUTICASONE-SALMETEROL 100-50 MCG/ACT AEPB 05/14/21  Yes [provider]  escitalopram  (LEXAPRO) 10 MG tablet Take 10 mg by mouth daily.    [provider]    Physical Exam: Vitals:   05/21/23 0858 05/21/23 1234  BP: (!) 154/105 (!) 146/94  Pulse: (!) 115 (!) 124  Resp: 20 (!) 21  Temp: 98.4 F (36.9 C) 98 F (36.7 C)  TempSrc: Oral Oral  SpO2: 92% 93%  Weight: 106.6 kg   Height: 6\' 1"  (1.854 m)    Physical Exam Vitals and nursing note reviewed.  Constitutional:      Appearance: He is overweight.  HENT:     Head: Normocephalic and atraumatic.     Mouth/Throat:     Mouth: Mucous membranes are moist.     Pharynx: Posterior oropharyngeal erythema present. No pharyngeal swelling or oropharyngeal exudate.     Tonsils: No tonsillar exudate.  Eyes:     Pupils: Pupils are equal, round, and reactive to light.  Cardiovascular:     Rate and Rhythm: Regular rhythm. Tachycardia present.     Heart sounds: No murmur heard. Pulmonary:     Effort: Tachypnea present.     Breath sounds: Wheezing (Diffuse) and rhonchi (Diffuse) present. No decreased breath sounds or rales.  Skin:    General: Skin is warm and dry.  Neurological:     General: No focal deficit present.     Mental Status: He is alert and oriented to person, place, and time.  Psychiatric:        Mood and Affect: Mood normal.        Behavior: Behavior normal.    Data Reviewed: CBC with WBC of 10.2, hemoglobin of 15.2,  platelets of 348 CMP with sodium of 136, potassium 3.1, bicarb 22, glucose 128, BUN 8, creatinine 0.96, AST 29, ALT 42, GFR above 60 COVID #19, influenza and RSV PCR negative  EKG personally reviewed.  Sinus rhythm with rate of 108.  No acute ischemic changes.  DG Chest 2 View Result Date: 05/21/2023 CLINICAL DATA:  Short of breath. EXAM: CHEST - 2 VIEW COMPARISON:  01/31/2023. FINDINGS: Normal heart, mediastinum and hila. Clear lungs.  No pleural effusion or pneumothorax. Skeletal structures are within normal limits. IMPRESSION: Normal chest radiographs. Electronically Signed   By:  Amie Portland M.D.   On: 05/21/2023 09:15   Results are pending, will review when available.  Assessment and Plan:  * Acute asthma exacerbation Patient is presenting with severe cough, congestion and shortness of breath likely due to viral etiology leading to an asthma exacerbation.  Chest x-ray is clear with no focal opacities to suggest bacterial etiology. No hypoxia at this time, however significant wheezing despite multiple breathing treatments.   - Continuous pulse oximetry start supplemental oxygen if he did to maintain oxygen saturation above 88% - Continue DuoNebs every 6 hours - Start Pulmicort twice daily - Continue prednisone 60 mg daily - Full respiratory viral panel  Sinus tachycardia Likely reactive in the setting of respiratory distress and multiple DuoNeb treatments.  - Telemetry monitoring overnight  Prediabetes No A1c available in chart.  - A1c pending  Elevated blood-pressure reading without diagnosis of hypertension Significantly hypertensive on exam, however likely related to respiratory issues.  If remains hypertensive prior to discharge, can consider antihypertensives versus close PCP follow-up  Advance Care Planning:   Code Status: Full Code   Consults: None  Family Communication: No family at bedside  Severity of Illness: The appropriate patient status for this patient is OBSERVATION. Observation status is judged to be reasonable and necessary in order to provide the required intensity of service to ensure the patient's safety. The patient's presenting symptoms, physical exam findings, and initial radiographic and laboratory data in the context of their medical condition is felt to place them at decreased risk for further clinical deterioration. Furthermore, it is anticipated that the patient will be medically stable for discharge from the hospital within 2 midnights of admission.   Author: Verdene Lennert, MD 05/21/2023 2:08 PM  For on call review  [email or web address].

## 2023-05-21 NOTE — Progress Notes (Incomplete)
       REMOTE CROSS COVER NOTE  NAME: Brent Bowen MRN: [id number] DOB : [birth year] ATTENDING PHYSICIAN: Verdene Lennert, MD    Date of Service   05/21/2023   HPI/Events of Note   Report/Request: Message received from RN reporting HR 150 BP 143/92 RR 16 SPO2 94% on RA and temp 98.3 F. RN reports Brent Bowen is breathing comfortably and not in distress.   HPI: 28 yo M admitted with asthma exacerbation after sick contact in his family. COVID, Flu, RSV, and Respiratory Pathogen panel all negative. Per EDP note prior to arrival he was using an OTC inhaler that contained epinephrine, though at this point 14 hours later I would not expect that to be contributory to current tachycardia.  Interventions   Assessment/Plan:  #Sinus Tachycardia #Asthma Exacerbation EKG--> ST HR 153, non-ischemic D-Dimer--> <0.27; TSH--> 1.002 1L NS bolus Ativan x1 CIWA -->3 Bedside evaluation requested from onsite [facility] MD who recommended Xopenex instead of Duonebs Will avoid Beta blockers in setting of asthma exacerbation, if tachycardia persists will consider Diltiazem.        To reach the provider On-Call:   7AM- 7PM see care teams to locate the attending and reach out to them via [email or web address]. Password: [email or web address] 7PM-7AM contact night-coverage If you still have difficulty reaching the appropriate provider, please page the [facility] (Director on Call) for [facility] on amion for assistance  This document was prepared using Conservation officer, historic buildings and may include unintentional dictation errors.  Bishop Limbo DNP, MBA, FNP-BC, PMHNP-BC Nurse Practitioner [facility] Pager [phone]

## 2023-05-21 NOTE — ED Notes (Signed)
 Assumed care of patient at 1241 and received report from the previous nurse.

## 2023-05-21 NOTE — Progress Notes (Signed)
 Docia Furl NP notified patients HR sustaining 130-150. Waiting on response

## 2023-05-21 NOTE — ED Notes (Signed)
 Called Floor to notify of patients transfer to room.

## 2023-05-21 NOTE — ED Notes (Signed)
 Messaged MD Huel Cote about patient explaining concerns that his breathing was still tight. Patients O2 100% on RA, RR: 22.

## 2023-05-21 NOTE — ED Provider Notes (Signed)
 [facility] Provider Note    Event Date/Time   First MD Initiated Contact with Patient 05/21/23 1005     (approximate)   History   Shortness of Breath   HPI  Brent Bowen is a 28 y.o. male with PMH of asthma and anxiety who presents for evaluation of cough, congestion and difficulty breathing for 2 days.  Patient states he had a family member that was sick recently and he just began having symptoms.  He ran out of his albuterol inhaler so he bought something over-the-counter, appears to contain epinephrine, and this has not been enough to treat his symptoms.      Physical Exam   Triage Vital Signs: ED Triage Vitals [05/21/23 0858]  Encounter Vitals Group     BP (!) 154/105     Systolic BP Percentile      Diastolic BP Percentile      Pulse Rate (!) 115     Resp 20     Temp 98.4 F (36.9 C)     Temp Source Oral     SpO2 92 %     Weight 235 lb (106.6 kg)     Height 6\' 1"  (1.854 m)     Head Circumference      Peak Flow      Pain Score 6     Pain Loc      Pain Education      Exclude from Growth Chart     Most recent vital signs: Vitals:   05/21/23 0858 05/21/23 1234  BP: (!) 154/105 (!) 146/94  Pulse: (!) 115 (!) 124  Resp: 20 (!) 21  Temp: 98.4 F (36.9 C) 98 F (36.7 C)  SpO2: 92% 93%   General: Awake, no distress.  CV:  Good peripheral perfusion.  RRR. Resp:  Normal effort.  Bilateral wheezing in all lung fields. Abd:  No distention.  Other:     ED Results / Procedures / Treatments   Labs (all labs ordered are listed, but only abnormal results are displayed) Labs Reviewed  CBC WITH DIFFERENTIAL/PLATELET - Abnormal; Notable for the following components:      Result Value   Eosinophils Absolute 0.7 (*)    All other components within normal limits  COMPREHENSIVE METABOLIC PANEL - Abnormal; Notable for the following components:   Potassium 3.1 (*)    Glucose, Bld 128 (*)    Calcium 8.3 (*)    All other  components within normal limits  RESP PANEL BY RT-PCR (RSV, FLU A&B, COVID)  [id number]     EKG  ED provider interpretation: Sinus tachycardia without ST changes.  Vent. rate 108 BPM PR interval 162 ms QRS duration 94 ms QT/QTcB 328/439 ms P-R-T axes 72 88 45   RADIOLOGY  Chest x-ray obtained, I interpreted the images as well as reviewed the radiologist report, which was negative for any acute cardiopulmonary abnormalities.   PROCEDURES:  Critical Care performed: No  Procedures   MEDICATIONS ORDERED IN ED: Medications  predniSONE (DELTASONE) tablet 60 mg (60 mg Oral Given 05/21/23 1031)  ipratropium-albuterol (DUONEB) 0.5-2.5 (3) MG/3ML nebulizer solution 3 mL (3 mLs Nebulization Given 05/21/23 1032)  ipratropium-albuterol (DUONEB) 0.5-2.5 (3) MG/3ML nebulizer solution 3 mL (3 mLs Nebulization Given 05/21/23 1032)  ipratropium-albuterol (DUONEB) 0.5-2.5 (3) MG/3ML nebulizer solution 3 mL (3 mLs Nebulization Given 05/21/23 1032)  ipratropium-albuterol (DUONEB) 0.5-2.5 (3) MG/3ML nebulizer solution 3 mL (3 mLs Nebulization Given 05/21/23 1146)     IMPRESSION /  MDM / ASSESSMENT AND PLAN / ED COURSE  I reviewed the triage vital signs and the nursing notes.                             28 year old male presents for evaluation of shortness of breath, cough and congestion for 2 days.  Patient was hypertensive and tachycardic upon presentation.  Patient NAD on exam.  Differential diagnosis includes, but is not limited to, viral infection, pneumonia, asthma exacerbation.  Patient's presentation is most consistent with acute complicated illness / injury requiring diagnostic workup.  Respiratory panel negative.  CBC unremarkable.  CMP shows mild hypokalemia otherwise normal.  On exam patient has wheezing in all lung fields in bilateral lungs.  Will treat him with a dose of prednisone and 3 breathing treatments.  Upon reassessment after receiving the breathing treatments patient is  still wheezy.  Will give him 1 more breathing treatment and reassess.  After the fourth breathing treatment, patient is still not improved.  At this point I do not feel like it is safe to send patient home.  Recommended inpatient admission for treatment.  Patient was agreeable to plan.    FINAL CLINICAL IMPRESSION(S) / ED DIAGNOSES   Final diagnoses:  Exacerbation of asthma, unspecified asthma severity, unspecified whether persistent     Rx / DC Orders   ED Discharge Orders     None        Note:  This document was prepared using Dragon voice recognition software and may include unintentional dictation errors.   Cameron Ali, PA-C 05/21/23 1246    Jene Every, MD 05/21/23 1328

## 2023-05-21 NOTE — Assessment & Plan Note (Addendum)
 Patient is presenting with severe cough, congestion and shortness of breath likely due to viral etiology leading to an asthma exacerbation.  Chest x-ray is clear with no focal opacities to suggest bacterial etiology. No hypoxia at this time, however significant wheezing despite multiple breathing treatments.   - Continuous pulse oximetry start supplemental oxygen if he did to maintain oxygen saturation above 88% - Continue DuoNebs every 6 hours. - Start Pulmicort twice daily - Continue prednisone 60 mg daily. Weaning to 40 mg daily. - Full respiratory viral panel is negative  The patient is feeling better this morning, but remains very wheezy. Will wean down steroids. May be ready for discharge tomorrow.

## 2023-05-22 LAB — URINE DRUG SCREEN, QUALITATIVE (ARMC ONLY)
Amphetamines, Ur Screen: NOT DETECTED
Barbiturates, Ur Screen: NOT DETECTED
Benzodiazepine, Ur Scrn: NOT DETECTED
Cannabinoid 50 Ng, Ur ~~LOC~~: POSITIVE — AB
Cocaine Metabolite,Ur ~~LOC~~: NOT DETECTED
MDMA (Ecstasy)Ur Screen: NOT DETECTED
Methadone Scn, Ur: NOT DETECTED
Opiate, Ur Screen: POSITIVE — AB
Phencyclidine (PCP) Ur S: NOT DETECTED
Tricyclic, Ur Screen: NOT DETECTED

## 2023-05-22 LAB — BASIC METABOLIC PANEL
Anion gap: 6 (ref 5–15)
BUN: 10 mg/dL (ref 6–20)
CO2: 23 mmol/L (ref 22–32)
Calcium: 9.6 mg/dL (ref 8.9–10.3)
Chloride: 111 mmol/L (ref 98–111)
Creatinine, Ser: 0.93 mg/dL (ref 0.61–1.24)
GFR, Estimated: >60 mL/min (ref >60)
Glucose, Bld: 147 mg/dL — ABNORMAL HIGH (ref 70–99)
Potassium: 4.3 mmol/L (ref 3.5–5.1)
Sodium: 140 mmol/L (ref 135–145)

## 2023-05-22 LAB — CBC
HCT: 42.7 % (ref 39.0–52.0)
Hemoglobin: 14.4 g/dL (ref 13.0–17.0)
MCH: 29.1 pg (ref 26.0–34.0)
MCHC: 33.7 g/dL (ref 30.0–36.0)
MCV: 86.4 fL (ref 80.0–100.0)
Platelets: 336 10*3/uL (ref 150–400)
RBC: 4.94 MIL/uL (ref 4.22–5.81)
RDW: 12.9 % (ref 11.5–15.5)
WBC: 15.9 10*3/uL — ABNORMAL HIGH (ref 4.0–10.5)
nRBC: 0 % (ref 0.0–0.2)

## 2023-05-22 LAB — HIV ANTIBODY (ROUTINE TESTING W REFLEX): HIV Screen 4th Generation wRfx: NONREACTIVE

## 2023-05-22 MED ORDER — METHYLPREDNISOLONE SODIUM SUCC 125 MG IJ SOLR
125.0000 mg | Freq: Once | INTRAMUSCULAR | Status: AC
  Administered 2023-05-22: 125 mg via INTRAVENOUS
  Filled 2023-05-22: qty 2

## 2023-05-22 MED ORDER — METOPROLOL TARTRATE 25 MG PO TABS
12.5000 mg | ORAL_TABLET | Freq: Two times a day (BID) | ORAL | Status: DC
  Administered 2023-05-22 – 2023-05-23 (×3): 12.5 mg via ORAL
  Filled 2023-05-22 (×3): qty 1

## 2023-05-22 MED ORDER — METHYLPREDNISOLONE SODIUM SUCC 40 MG IJ SOLR
40.0000 mg | Freq: Two times a day (BID) | INTRAMUSCULAR | Status: DC
  Administered 2023-05-22 – 2023-05-23 (×2): 40 mg via INTRAVENOUS
  Filled 2023-05-22 (×2): qty 1

## 2023-05-22 MED ORDER — METOPROLOL TARTRATE 5 MG/5ML IV SOLN
5.0000 mg | Freq: Once | INTRAVENOUS | Status: AC
  Administered 2023-05-22: 5 mg via INTRAVENOUS
  Filled 2023-05-22: qty 5

## 2023-05-22 MED ORDER — LEVALBUTEROL HCL 1.25 MG/0.5ML IN NEBU
1.2500 mg | INHALATION_SOLUTION | Freq: Four times a day (QID) | RESPIRATORY_TRACT | Status: DC
  Administered 2023-05-22 – 2023-05-23 (×6): 1.25 mg via RESPIRATORY_TRACT
  Filled 2023-05-22 (×8): qty 0.5

## 2023-05-22 MED ORDER — MAGNESIUM SULFATE 2 GM/50ML IV SOLN
2.0000 g | Freq: Once | INTRAVENOUS | Status: AC
  Administered 2023-05-22: 2 g via INTRAVENOUS
  Filled 2023-05-22: qty 50

## 2023-05-22 NOTE — Plan of Care (Signed)

## 2023-05-22 NOTE — Progress Notes (Signed)
 K Foust NP notified HR still 140's, bolus finished and ativan on board

## 2023-05-22 NOTE — Progress Notes (Signed)
  Progress Note   Patient: Brent Bowen HYQ:[id number] DOB: September 06, 1995 DOA: 05/21/2023     0 DOS: the patient was seen and examined on 05/22/2023   Brief hospital course: The patient is a 28 yr old man with a past medical history of mild intermittent asthma. He presented to [facility] ED on 05/21/2023 with complaints of shortness of breath. He had been trying to control this attach with his inhalers at home, but he ran out. He obtained an over-the-counter epinephrine based inhaler, but it was nor effective. He denied fevers, chills, nausea, vomiting, abdominal pain or chest pain.  He was admitted to a med-surg bed.   Assessment and Plan: * Exacerbation of asthma Patient is presenting with severe cough, congestion and shortness of breath likely due to viral etiology leading to an asthma exacerbation.  Chest x-ray is clear with no focal opacities to suggest bacterial etiology. No hypoxia at this time, however significant wheezing despite multiple breathing treatments.   - Continuous pulse oximetry start supplemental oxygen if he did to maintain oxygen saturation above 88% - Continue DuoNebs every 6 hours. - Start Pulmicort twice daily - Continue prednisone 60 mg daily. Weaning to 40 mg daily. - Full respiratory viral panel is negative  The patient is feeling better this morning, but remains very wheezy. Will wean down steroids. May be ready for discharge tomorrow.  Sinus tachycardia Likely reactive and due to beta adronergic result of albuterol treatments. HR increased to 160's overnight and initially the patient was to be transferred to the CVU. However, upon examining the patient this morning his heart rate had decreased to the 120's and he was in no distress. Transfer was discontinued. He was started on low dose metoprolol.  Prediabetes HbA1c is 5.5.  Elevated blood-pressure reading without diagnosis of hypertension Currently normotensive.        Subjective: The patient is  resting comfortably. No new complaints. No chest pain or increased shortness of breath.  Physical Exam: Vitals:   05/22/23 0746 05/22/23 0925 05/22/23 1409 05/22/23 1607  BP: 128/72   117/67  Pulse: (!) 134 (!) 135  (!) 122  Resp: 19   19  Temp: 98.5 F (36.9 C)   98.6 F (37 C)  TempSrc:      SpO2: 93%  96% 92%  Weight:      Height:       Exam:  Constitutional:  The patient is awake, alert, and oriented x 3. No acute distress. Respiratory:  No increased work of breathing. Positive for wheezes throughout. No rales, or rhonchi No tactile fremitus Cardiovascular:  Regular rate and rhythm Rate is fast. No murmurs, ectopy, or gallups. No lateral PMI. No thrills. Abdomen:  Abdomen is soft, non-tender, non-distended No hernias, masses, or organomegaly Normoactive bowel sounds.  Musculoskeletal:  No cyanosis, clubbing, or edema Skin:  No rashes, lesions, ulcers palpation of skin: no induration or nodules Neurologic:  CN 2-12 intact Sensation all 4 extremities intact Psychiatric:  Mental status Mood, affect appropriate Orientation to person, place, time  judgment and insight appear intact  Data Reviewed:  CBC BMP CXR  Family Communication: None available  Disposition: Status is: Inpatient Remains inpatient appropriate because: Unresolved tachycardia and wheezing.  Planned Discharge Destination: Home    Time spent: 34 minutes  Author: Terrie Grajales, DO 05/22/2023 4:44 PM  For on call review [email or web address].

## 2023-05-22 NOTE — Significant Event (Signed)
 Rapid Response Event Note   Reason for Call :  HR 160-170 following breathing treatment.  Initial Focused Assessment:  Patients relaxed not SOB. Patient has no chest pain or palpitations.  HR 158 BP 130/73 O2 96% on RA. Dr Ninfa Linden at bedside.  0500 check In Patient heart Rate low 130s. DR Para March aware and set Heart rate goal lower then 140 sutained Interventions:  -Solu Medrol  -IV Magnesium -Metoprolol IV if patient's Hr does not respond consider transferring patient to PCU. Patient and Family member agreeable with plan. MD Notified: Dr [id number] Call [id number] Arrival [id number] End Time:0306  Judyann Munson, RN

## 2023-05-22 NOTE — Progress Notes (Addendum)
      Rapid Response CROSS COVER NOTE  NAME: Brent Bowen MRN: [id number] DOB : 29-May-1995    Concern as stated by nurse / staff   Rapid response called for tachycardia up to the 160s.  Patient was admitted earlier with acute exacerbation of asthma and noted to be in sinus tachycardia that was attributed to his respiratory distress along with multiple DuoNebs.  Following admission he remained tachycardic and an extensive workup was ordered by remote floor coverage that was reassuring-,  normal TSH, D-dimer, nonseptic, nonischemic EKG. I was consulted earlier by remote floor coverage to see patient and he was noted to be sleeping comfortably.  He aroused with gentle shaking.  He was noted to be wheezing at the time. My Recommendation made at the time to switch DuoNebs to Xopenex.  Rapid response was called not long after his next Xopenex dose when his heart rate went up to the 160s     Physical exam Upon my arrival in the room, rapid response team and nurses at bedside. Patient appears comfortable expressing surprise at the activity in the room.  We explained that we were concerned about his high heart rate    05/22/2023    2:45 AM 05/22/2023    2:29 AM 05/21/2023   11:42 PM  Vitals with BMI  Systolic 130 136 409  Diastolic 73 63 95  Pulse 159 144 158   Physical Exam Vitals and nursing note reviewed.  Constitutional:      General: He is not in acute distress. HENT:     Head: Normocephalic and atraumatic.  Cardiovascular:     Rate and Rhythm: Normal rate and regular rhythm.     Heart sounds: Normal heart sounds.  Pulmonary:     Effort: Tachypnea present.     Breath sounds: Wheezing present.     Comments: Wheezing heard throughout entire lung field, a bit more prominent than when patient was examined earlier Abdominal:     Palpations: Abdomen is soft.     Tenderness: There is no abdominal tenderness.  Neurological:     Mental Status: Mental status is at  baseline.      Pertinent findings on chart review:   Assessment and  Interventions   Assessment: --Sinus tachycardia-likely physiologic related to asthma exacerbation, exacerbated by nebulizer treatments.  Still actively wheezing but appears comfortable --Asthma exacerbation   Plan: Will treat exacerbation more aggressively by switching from prednisone to Solu-Medrol Solu-Medrol 125 IV x 1 to follow with 40 mg twice daily thereafter IV magnesium Will give a one-time dose of metoprolol to try to keep heart rate under 140 given that he has had been very tachycardic for going on 12 hours Continue to monitor heart rate      CRITICAL CARE Performed by: Andris Baumann   Total critical care time: 35 minutes  Critical care time was exclusive of separately billable procedures and treating other patients.  Critical care was necessary to treat or prevent imminent or life-threatening deterioration.  Critical care was time spent personally by me on the following activities: development of treatment plan with patient and/or surrogate as well as nursing, discussions with consultants, evaluation of patient's response to treatment, examination of patient, obtaining history from patient or surrogate, ordering and performing treatments and interventions, ordering and review of laboratory studies, ordering and review of radiographic studies, pulse oximetry and re-evaluation of patient's condition.

## 2023-05-22 NOTE — Progress Notes (Signed)
 Rapid response called, nsg supervisor in to see pt, Dr Para March in to see pt, orders given

## 2023-05-23 ENCOUNTER — Other Ambulatory Visit: Payer: Self-pay

## 2023-05-23 LAB — BASIC METABOLIC PANEL
Anion gap: 9 (ref 5–15)
BUN: 13 mg/dL (ref 6–20)
CO2: 23 mmol/L (ref 22–32)
Calcium: 9.5 mg/dL (ref 8.9–10.3)
Chloride: 106 mmol/L (ref 98–111)
Creatinine, Ser: 0.88 mg/dL (ref 0.61–1.24)
GFR, Estimated: >60 mL/min (ref >60)
Glucose, Bld: 143 mg/dL — ABNORMAL HIGH (ref 70–99)
Potassium: 4.3 mmol/L (ref 3.5–5.1)
Sodium: 138 mmol/L (ref 135–145)

## 2023-05-23 LAB — CBC
HCT: 44.8 % (ref 39.0–52.0)
Hemoglobin: 14.9 g/dL (ref 13.0–17.0)
MCH: 28.8 pg (ref 26.0–34.0)
MCHC: 33.3 g/dL (ref 30.0–36.0)
MCV: 86.7 fL (ref 80.0–100.0)
Platelets: 380 10*3/uL (ref 150–400)
RBC: 5.17 MIL/uL (ref 4.22–5.81)
RDW: 13 % (ref 11.5–15.5)
WBC: 21.9 10*3/uL — ABNORMAL HIGH (ref 4.0–10.5)
nRBC: 0 % (ref 0.0–0.2)

## 2023-05-23 MED ORDER — METHYLPREDNISOLONE 4 MG PO TBPK
ORAL_TABLET | ORAL | 0 refills | Status: DC
  Filled 2023-05-23: qty 21, 6d supply, fill #0

## 2023-05-23 MED ORDER — BUDESONIDE 0.5 MG/2ML IN SUSP
0.5000 mg | Freq: Two times a day (BID) | RESPIRATORY_TRACT | 0 refills | Status: DC
  Filled 2023-05-23 (×2): qty 60, 15d supply, fill #0

## 2023-05-23 MED ORDER — IPRATROPIUM-ALBUTEROL 0.5-2.5 (3) MG/3ML IN SOLN
3.0000 mL | RESPIRATORY_TRACT | 0 refills | Status: DC | PRN
  Filled 2023-05-23 (×2): qty 360, 20d supply, fill #0

## 2023-05-23 MED ORDER — ALUM & MAG HYDROXIDE-SIMETH 200-200-20 MG/5ML PO SUSP
30.0000 mL | Freq: Four times a day (QID) | ORAL | Status: DC | PRN
  Administered 2023-05-23: 30 mL via ORAL
  Filled 2023-05-23: qty 30

## 2023-05-23 NOTE — Plan of Care (Signed)

## 2023-05-23 NOTE — Discharge Summary (Signed)
 Physician Discharge Summary  Brent Bowen EXB:[id number] DOB: May 06, 1995 DOA: 05/21/2023  PCP: [facility], Brent Bowen  Admit date: 05/21/2023 Discharge date: 05/23/2023  Admitted From: Home Disposition: Home  Recommendations for Outpatient Follow-up:  Follow up with PCP in 1-2 weeks Highly recommend following up with a pulmonologist  Home Bowen: None Equipment/Devices: None  Discharge Condition: Stable CODE STATUS: Full Diet recommendation: Low-salt low-fat low-carb diet  Brief/Interim Summary: The patient is a 28 yr old man with a past medical history of mild intermittent asthma. He presented to [facility] ED on 05/21/2023 with complaints of profound dyspnea at rest essentially unable to ambulate/perform ADLs due to respiratory distress.  Patient admitted as above with acute respiratory failure in the setting of asthma exacerbation.  He thankfully was without hypoxia but notably dyspneic, unable to perform ADLs in setting of respiratory distress.  Lengthy discussion about patient's lack of medical follow-up and care.  He has been attempting to treat his chronic asthma with over-the-counter medications.  Discussed initiation of nebulizer at home as well as inhaler but would prefer patient follow-up with pulmonologist or PCP to ensure close follow-up and to avoid further exacerbations.  Unclear if patient will be able to follow-up given his lack of insurance through his current job.  Discharge Diagnoses:  Principal Problem:   Exacerbation of asthma Active Problems:   Sinus tachycardia   Prediabetes   Elevated blood-pressure reading without diagnosis of hypertension   Acute respiratory failure without hypoxia Acute exacerbation of asthma Patient is presenting with severe cough, congestion and shortness of breath likely due to viral etiology leading to an asthma exacerbation.  Chest x-ray is clear with no focal opacities to suggest bacterial etiology. No hypoxia at  this time, however significant wheezing despite multiple breathing treatments.  -Patient improving, maintains normoxia without supplementation even with exertion.  Wheezing improving, will discharge patient with prescription for nebulizer, DuoNebs, Pulmicort and steroid taper.  Attempting to have these filled at our facility to ensure compliance   Sinus tachycardia Reactive in the setting of above, resolving -complicated by high-dose steroids   Prediabetes HbA1c is 5.5.   Elevated blood-pressure reading without diagnosis of hypertension Currently normotensive.  Discharge Instructions  Discharge Instructions     For home use only DME Nebulizer machine   Complete by: As directed    Patient needs a nebulizer to treat with the following condition: Asthma   Length of Need: Lifetime   Additional equipment included:  Administration kit Filter        Allergies as of 05/23/2023   No Known Allergies      Medication List     STOP taking these medications    albuterol 108 (90 Base) MCG/ACT inhaler Commonly known as: VENTOLIN HFA       TAKE these medications    budesonide 0.5 MG/2ML nebulizer solution Commonly known as: PULMICORT Take 2 mLs (0.5 mg total) by nebulization 2 (two) times daily.   ipratropium-albuterol 0.5-2.5 (3) MG/3ML Soln Commonly known as: DUONEB Take 3 mLs by nebulization every 4 (four) hours as needed.   methylPREDNISolone 4 MG Tbpk tablet Commonly known as: MEDROL DOSEPAK Taper as directed   PRIMATENE MIST IN Inhale 1-2 puffs into the lungs every 4 (four) hours as needed.               Durable Medical Equipment  (From admission, onward)           Start     Ordered   05/23/23 0000  For home use only DME Nebulizer machine       Question Answer Comment  Patient needs a nebulizer to treat with the following condition Asthma   Length of Need Lifetime   Additional equipment included Administration kit   Additional equipment included  Filter      05/23/23 1203            No Known Allergies  Consultations: None  Procedures/Studies: DG Chest 2 View Result Date: 05/21/2023 CLINICAL DATA:  Short of breath. EXAM: CHEST - 2 VIEW COMPARISON:  01/31/2023. FINDINGS: Normal heart, mediastinum and hila. Clear lungs.  No pleural effusion or pneumothorax. Skeletal structures are within normal limits. IMPRESSION: Normal chest radiographs. Electronically Signed   By: Amie Portland M.D.   On: 05/21/2023 09:15     Subjective: No acute issues or events overnight, respiratory status improved   Discharge Exam: Vitals:   05/23/23 0734 05/23/23 0807  BP:  (!) 151/88  Pulse:  (!) 106  Resp:  18  Temp:  98 F (36.7 C)  SpO2: 90% 91%   Vitals:   05/23/23 0131 05/23/23 0432 05/23/23 0734 05/23/23 0807  BP:  138/89  (!) 151/88  Pulse:  100  (!) 106  Resp:  20  18  Temp:  98 F (36.7 C)  98 F (36.7 C)  TempSrc:      SpO2: 93% 90% 90% 91%  Weight:      Height:        General: Pt is alert, awake, not in acute distress Cardiovascular: RRR, S1/S2 +, no rubs, no gallops Respiratory: CTA bilaterally, no wheezing, no rhonchi Abdominal: Soft, NT, ND, bowel sounds + Extremities: no edema, no cyanosis    The results of significant diagnostics from this hospitalization (including imaging, microbiology, ancillary and laboratory) are listed below for reference.     Microbiology: Recent Results (from the past 240 hours)  Resp panel by RT-PCR (RSV, Flu A&B, Covid) Anterior Nasal Swab     Status: None   Collection Time: 05/21/23  9:39 AM   Specimen: Anterior Nasal Swab  Result Value Ref Range Status   SARS Coronavirus 2 by RT PCR NEGATIVE NEGATIVE Final    Comment: (NOTE) SARS-CoV-2 target nucleic acids are NOT DETECTED.  The SARS-CoV-2 RNA is generally detectable in upper respiratory specimens during the acute phase of infection. The lowest concentration of SARS-CoV-2 viral copies this assay can detect is 138  copies/mL. A negative result does not preclude SARS-Cov-2 infection and should not be used as the sole basis for treatment or other patient management decisions. A negative result may occur with  improper specimen collection/handling, submission of specimen other than nasopharyngeal swab, presence of viral mutation(s) within the areas targeted by this assay, and inadequate number of viral copies(<138 copies/mL). A negative result must be combined with clinical observations, patient history, and epidemiological information. The expected result is Negative.  Fact Sheet for Patients:  BloggerCourse.com  Fact Sheet for Healthcare Providers:  SeriousBroker.it  This test is no t yet approved or cleared by the [place] FDA and  has been authorized for detection and/or diagnosis of SARS-CoV-2 by FDA under an Emergency Use Authorization (EUA). This EUA will remain  in effect (meaning this test can be used) for the duration of the COVID-19 declaration under Section 564(b)(1) of the Act, 21 U.S.C.section 360bbb-3(b)(1), unless the authorization is terminated  or revoked sooner.       Influenza A by PCR NEGATIVE NEGATIVE Final   Influenza B  by PCR NEGATIVE NEGATIVE Final    Comment: (NOTE) The Xpert Xpress SARS-CoV-2/FLU/RSV plus assay is intended as an aid in the diagnosis of influenza from Nasopharyngeal swab specimens and should not be used as a sole basis for treatment. Nasal washings and aspirates are unacceptable for Xpert Xpress SARS-CoV-2/FLU/RSV testing.  Fact Sheet for Patients: BloggerCourse.com  Fact Sheet for Healthcare Providers: SeriousBroker.it  This test is not yet approved or cleared by the [place] FDA and has been authorized for detection and/or diagnosis of SARS-CoV-2 by FDA under an Emergency Use Authorization (EUA). This EUA will remain in effect (meaning  this test can be used) for the duration of the COVID-19 declaration under Section 564(b)(1) of the Act, 21 U.S.C. section 360bbb-3(b)(1), unless the authorization is terminated or revoked.     Resp Syncytial Virus by PCR NEGATIVE NEGATIVE Final    Comment: (NOTE) Fact Sheet for Patients: BloggerCourse.com  Fact Sheet for Healthcare Providers: SeriousBroker.it  This test is not yet approved or cleared by the [place] FDA and has been authorized for detection and/or diagnosis of SARS-CoV-2 by FDA under an Emergency Use Authorization (EUA). This EUA will remain in effect (meaning this test can be used) for the duration of the COVID-19 declaration under Section 564(b)(1) of the Act, 21 U.S.C. section 360bbb-3(b)(1), unless the authorization is terminated or revoked.  Performed at [facility], [address] [place], [place] [address]   Respiratory (~20 pathogens) panel by PCR     Status: None   Collection Time: 05/21/23 12:59 PM   Specimen: Nasopharyngeal Swab; Respiratory  Result Value Ref Range Status   Adenovirus NOT DETECTED NOT DETECTED Final   Coronavirus 229E NOT DETECTED NOT DETECTED Final    Comment: (NOTE) The Coronavirus on the Respiratory Panel, DOES NOT test for the novel  Coronavirus (2019 nCoV)    Coronavirus HKU1 NOT DETECTED NOT DETECTED Final   Coronavirus NL63 NOT DETECTED NOT DETECTED Final   Coronavirus OC43 NOT DETECTED NOT DETECTED Final   Metapneumovirus NOT DETECTED NOT DETECTED Final   Rhinovirus / Enterovirus NOT DETECTED NOT DETECTED Final   Influenza A NOT DETECTED NOT DETECTED Final   Influenza B NOT DETECTED NOT DETECTED Final   Parainfluenza Virus 1 NOT DETECTED NOT DETECTED Final   Parainfluenza Virus 2 NOT DETECTED NOT DETECTED Final   Parainfluenza Virus 3 NOT DETECTED NOT DETECTED Final   Parainfluenza Virus 4 NOT DETECTED NOT DETECTED Final   Respiratory Syncytial Virus NOT  DETECTED NOT DETECTED Final   Bordetella pertussis NOT DETECTED NOT DETECTED Final   Bordetella Parapertussis NOT DETECTED NOT DETECTED Final   Chlamydophila pneumoniae NOT DETECTED NOT DETECTED Final   Mycoplasma pneumoniae NOT DETECTED NOT DETECTED Final    Comment: Performed at [facility], [address] [place], [place] [address]     Labs: BNP (last 3 results) No results for input(s): "BNP" in the last 8760 hours. Basic Metabolic Panel: Recent Labs  Lab 05/21/23 0900 05/22/23 0559 05/23/23 0614  NA 136 140 138  K 3.1* 4.3 4.3  CL 107 111 106  CO2 22 23 23   GLUCOSE 128* 147* 143*  BUN 8 10 13   CREATININE 0.96 0.93 0.88  CALCIUM 8.3* 9.6 9.5   Liver Function Tests: Recent Labs  Lab 05/21/23 0900  AST 29  ALT 42  ALKPHOS 68  BILITOT 0.8  PROT 7.5  ALBUMIN 4.0   No results for input(s): "LIPASE", "AMYLASE" in the last 168 hours. No results for  input(s): "AMMONIA" in the last 168 hours. CBC: Recent Labs  Lab 05/21/23 0900 05/22/23 0559 05/23/23 0614  WBC 10.2 15.9* 21.9*  NEUTROABS 6.3  --   --   HGB 15.1 14.4 14.9  HCT 45.5 42.7 44.8  MCV 86.7 86.4 86.7  PLT 348 336 380   Cardiac Enzymes: No results for input(s): "CKTOTAL", "CKMB", "CKMBINDEX", "TROPONINI" in the last 168 hours. BNP: Invalid input(s): "POCBNP" CBG: No results for input(s): "GLUCAP" in the last 168 hours. D-Dimer Recent Labs    05/21/23 2308  DDIMER <0.27   Hgb A1c Recent Labs    05/21/23 0900  HGBA1C 5.5   Lipid Profile No results for input(s): "CHOL", "HDL", "LDLCALC", "TRIG", "CHOLHDL", "LDLDIRECT" in the last 72 hours. Thyroid function studies Recent Labs    05/21/23 2306  TSH 1.002   Anemia work up No results for input(s): "VITAMINB12", "FOLATE", "FERRITIN", "TIBC", "IRON", "RETICCTPCT" in the last 72 hours. Urinalysis    Component Value Date/Time   COLORURINE Straw 10/18/2013 1145   APPEARANCEUR Clear 10/18/2013 1145   LABSPEC 1.011 10/18/2013 1145    PHURINE 6.0 10/18/2013 1145   GLUCOSEU Negative 10/18/2013 1145   HGBUR Negative 10/18/2013 1145   BILIRUBINUR Negative 10/18/2013 1145   KETONESUR Negative 10/18/2013 1145   PROTEINUR Negative 10/18/2013 1145   NITRITE Negative 10/18/2013 1145   LEUKOCYTESUR Negative 10/18/2013 1145   Sepsis Labs Recent Labs  Lab 05/21/23 0900 05/22/23 0559 05/23/23 0614  WBC 10.2 15.9* 21.9*   Microbiology Recent Results (from the past 240 hours)  Resp panel by RT-PCR (RSV, Flu A&B, Covid) Anterior Nasal Swab     Status: None   Collection Time: 05/21/23  9:39 AM   Specimen: Anterior Nasal Swab  Result Value Ref Range Status   SARS Coronavirus 2 by RT PCR NEGATIVE NEGATIVE Final    Comment: (NOTE) SARS-CoV-2 target nucleic acids are NOT DETECTED.  The SARS-CoV-2 RNA is generally detectable in upper respiratory specimens during the acute phase of infection. The lowest concentration of SARS-CoV-2 viral copies this assay can detect is 138 copies/mL. A negative result does not preclude SARS-Cov-2 infection and should not be used as the sole basis for treatment or other patient management decisions. A negative result may occur with  improper specimen collection/handling, submission of specimen other than nasopharyngeal swab, presence of viral mutation(s) within the areas targeted by this assay, and inadequate number of viral copies(<138 copies/mL). A negative result must be combined with clinical observations, patient history, and epidemiological information. The expected result is Negative.  Fact Sheet for Patients:  BloggerCourse.com  Fact Sheet for Healthcare Providers:  SeriousBroker.it  This test is no t yet approved or cleared by the [place] FDA and  has been authorized for detection and/or diagnosis of SARS-CoV-2 by FDA under an Emergency Use Authorization (EUA). This EUA will remain  in effect (meaning this test can be used)  for the duration of the COVID-19 declaration under Section 564(b)(1) of the Act, 21 U.S.C.section 360bbb-3(b)(1), unless the authorization is terminated  or revoked sooner.       Influenza A by PCR NEGATIVE NEGATIVE Final   Influenza B by PCR NEGATIVE NEGATIVE Final    Comment: (NOTE) The Xpert Xpress SARS-CoV-2/FLU/RSV plus assay is intended as an aid in the diagnosis of influenza from Nasopharyngeal swab specimens and should not be used as a sole basis for treatment. Nasal washings and aspirates are unacceptable for Xpert Xpress SARS-CoV-2/FLU/RSV testing.  Fact Sheet for Patients: BloggerCourse.com  Fact Sheet  for Healthcare Providers: SeriousBroker.it  This test is not yet approved or cleared by the [place] and has been authorized for detection and/or diagnosis of SARS-CoV-2 by FDA under an Emergency Use Authorization (EUA). This EUA will remain in effect (meaning this test can be used) for the duration of the COVID-19 declaration under Section 564(b)(1) of the Act, 21 U.S.C. section 360bbb-3(b)(1), unless the authorization is terminated or revoked.     Resp Syncytial Virus by PCR NEGATIVE NEGATIVE Final    Comment: (NOTE) Fact Sheet for Patients: BloggerCourse.com  Fact Sheet for Healthcare Providers: SeriousBroker.it  This test is not yet approved or cleared by the [place] FDA and has been authorized for detection and/or diagnosis of SARS-CoV-2 by FDA under an Emergency Use Authorization (EUA). This EUA will remain in effect (meaning this test can be used) for the duration of the COVID-19 declaration under Section 564(b)(1) of the Act, 21 U.S.C. section 360bbb-3(b)(1), unless the authorization is terminated or revoked.  Performed at [facility], [address] [place], [place] [address]   Respiratory (~20 pathogens) panel by PCR      Status: None   Collection Time: 05/21/23 12:59 PM   Specimen: Nasopharyngeal Swab; Respiratory  Result Value Ref Range Status   Adenovirus NOT DETECTED NOT DETECTED Final   Coronavirus 229E NOT DETECTED NOT DETECTED Final    Comment: (NOTE) The Coronavirus on the Respiratory Panel, DOES NOT test for the novel  Coronavirus (2019 nCoV)    Coronavirus HKU1 NOT DETECTED NOT DETECTED Final   Coronavirus NL63 NOT DETECTED NOT DETECTED Final   Coronavirus OC43 NOT DETECTED NOT DETECTED Final   Metapneumovirus NOT DETECTED NOT DETECTED Final   Rhinovirus / Enterovirus NOT DETECTED NOT DETECTED Final   Influenza A NOT DETECTED NOT DETECTED Final   Influenza B NOT DETECTED NOT DETECTED Final   Parainfluenza Virus 1 NOT DETECTED NOT DETECTED Final   Parainfluenza Virus 2 NOT DETECTED NOT DETECTED Final   Parainfluenza Virus 3 NOT DETECTED NOT DETECTED Final   Parainfluenza Virus 4 NOT DETECTED NOT DETECTED Final   Respiratory Syncytial Virus NOT DETECTED NOT DETECTED Final   Bordetella pertussis NOT DETECTED NOT DETECTED Final   Bordetella Parapertussis NOT DETECTED NOT DETECTED Final   Chlamydophila pneumoniae NOT DETECTED NOT DETECTED Final   Mycoplasma pneumoniae NOT DETECTED NOT DETECTED Final    Comment: Performed at [facility], [address], [place], [place] [address]     Time coordinating discharge: Over 30 minutes  SIGNED:   Azucena Fallen, DO [facility] 05/23/2023, 12:04 PM Pager   If 7PM-7AM, please contact night-coverage [email or web address]

## 2023-05-23 NOTE — Progress Notes (Signed)
 SATURATION QUALIFICATIONS: (This note is used to comply with regulatory documentation for home oxygen)  Patient Saturations on Room Air at Rest = 90%  Patient Saturations on Room Air while Ambulating = 90%

## 2024-03-01 ENCOUNTER — Encounter: Payer: Self-pay | Admitting: Family Medicine

## 2024-03-01 ENCOUNTER — Ambulatory Visit: Payer: Self-pay | Admitting: Family Medicine

## 2024-03-01 VITALS — BP 136/94 | HR 87 | Temp 98.0°F | Ht 73.0 in | Wt 259.2 lb

## 2024-03-01 DIAGNOSIS — Z7689 Persons encountering health services in other specified circumstances: Secondary | ICD-10-CM

## 2024-03-01 DIAGNOSIS — Z23 Encounter for immunization: Secondary | ICD-10-CM

## 2024-03-01 DIAGNOSIS — R030 Elevated blood-pressure reading, without diagnosis of hypertension: Secondary | ICD-10-CM

## 2024-03-01 DIAGNOSIS — J4540 Moderate persistent asthma, uncomplicated: Secondary | ICD-10-CM

## 2024-03-01 DIAGNOSIS — Z1159 Encounter for screening for other viral diseases: Secondary | ICD-10-CM

## 2024-03-01 DIAGNOSIS — Z136 Encounter for screening for cardiovascular disorders: Secondary | ICD-10-CM

## 2024-03-01 MED ORDER — FLUTICASONE-SALMETEROL 100-50 MCG/ACT IN AEPB: 1.0000 | INHALATION_SPRAY | Freq: Two times a day (BID) | RESPIRATORY_TRACT | 6 refills | Status: AC

## 2024-03-01 MED ORDER — FLUTICASONE-SALMETEROL 100-50 MCG/ACT IN AEPB: 1.0000 | INHALATION_SPRAY | Freq: Two times a day (BID) | RESPIRATORY_TRACT | 3 refills | Status: DC

## 2024-03-01 MED ORDER — ALBUTEROL SULFATE HFA 108 (90 BASE) MCG/ACT IN AERS: 2.0000 | INHALATION_SPRAY | Freq: Four times a day (QID) | RESPIRATORY_TRACT | 6 refills | Status: AC | PRN

## 2024-03-01 NOTE — Assessment & Plan Note (Addendum)
 Asthma control test score 9 today, indicating poor control.  Exacerbated by allergens, poorly controlled with frequent Primatene use. No insurance (self-pay) limits medication access.  Previous sinus tachycardia during hospitalization likely stemmed from persistent use of albuterol  to address patient's immediate symptoms of asthma exacerbation. - Prescribed albuterol  inhaler with GoodRx discount, available at [facility] for $33. - Prescribed Advair for daily use, available at [facility] for $56. - Advised nightly Allegra, especially before cat exposure. - Educated on albuterol  as rescue inhaler and Advair for control. - Discussed epinephrine inhaler side effects and importance of prescribed inhalers.

## 2024-03-01 NOTE — Progress Notes (Signed)
 "   New patient visit   Patient: Brent Bowen   DOB: Dec 08, 1995   28 y.o. Male  MRN: [id number] Visit Date: 03/01/2024  Today's healthcare provider: LAURAINE LOISE BUOY, DO   Chief Complaint  Patient presents with   New Patient (Initial Visit)    Patient is here for establish of care, concerns of asthma and possible heart problems.  HPV- yes  Other vaccines declined.   Subjective    Brent Bowen is a 28 y.o. male who presents today as a new patient to establish care.  HPI HPI     New Patient (Initial Visit)    Additional comments: Patient is here for establish of care, concerns of asthma and possible heart problems.  HPV- yes  Other vaccines declined.      Last edited by Terrel Powell CROME, CMA on 03/01/2024 10:21 AM.       Brent Bowen is a 28 year old male with asthma who presents with concerns about asthma management and possible heart issues. He is accompanied by his girlfriend.  He has a history of asthma and was previously prescribed albuterol , which became too expensive. He has been using an over-the-counter epinephrine inhaler (Primatene) daily, multiple times a day, due to difficulty with symptoms. He and his girlfriend are concerned about the potential impact of frequent epinephrine use on his heart. He also has a nebulizer at home, but the medication for it is costly, and he cannot always take it with him due to work travel. He works outside, which makes managing his asthma more challenging.  He experienced bronchitis at the end of 2024 and an asthma exacerbation in March 2025. He has itchy eyes, particularly when around a cat, to which he is allergic. He takes Allegra occasionally, usually at night, but not consistently. He uses the epinephrine inhaler even when away from the cat, about two to three times a day.  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 He experiences shortness of breath once or twice a week and uses his inhaler more than once a day. His asthma symptoms wake him up a couple of times a week.  He has a history of prediabetes diagnosed in November 2024. He has gained 24 pounds since March 2025, when he weighed 235 pounds. He reports eating poorly, especially when traveling for work, and experiences stomach issues such as pain and nausea after eating. He has felt his heart racing at times, and during a hospital visit, his heart rate was high enough to warrant bringing in a defibrillator machine.    Past Medical History:  Diagnosis Date   Allergy    Asthma    Heart murmur    History reviewed. No pertinent surgical history. No family status information on file.   History reviewed. No pertinent family history. Social History   Socioeconomic History   Marital status: Significant Other    Spouse name: Not on file   Number of children: Not on file   Years of education: Not on file   Highest education level: Not on file  Occupational History   Not on file  Tobacco Use   Smoking status: Former    Types: Cigarettes   Smokeless tobacco: Never  Vaping Use   Vaping status: Every Day  Substance and Sexual Activity   Alcohol use: Yes    Alcohol/week: 24.0 standard drinks of alcohol    Types: 24 Cans of  beer per week   Drug use: Yes    Types: Marijuana   Sexual activity: Yes    Birth control/protection: None  Other Topics Concern   Not on file  Social History Narrative   Not on file   Social Drivers of Health   Tobacco Use: Medium Risk (03/01/2024)   Patient History    Smoking Tobacco Use: Former    Smokeless Tobacco Use: Never    Passive Exposure: Not on Actuary Strain: Not on file  Food Insecurity: No Food Insecurity (05/21/2023)   Hunger Vital Sign    Worried About Running Out of Food in the Last Year: Never true    Ran Out of Food in the Last Year: Never true  Transportation  Needs: No Transportation Needs (05/21/2023)   PRAPARE - Administrator, Civil Service (Medical): No    Lack of Transportation (Non-Medical): No  Physical Activity: Not on file  Stress: Not on file  Social Connections: Moderately Isolated (05/21/2023)   Social Connection and Isolation Panel    Frequency of Communication with Friends and Family: More than three times a week    Frequency of Social Gatherings with Friends and Family: More than three times a week    Attends Religious Services: 1 to 4 times per year    Active Member of Clubs or Organizations: No    Attends Banker Meetings: Never    Marital Status: Never married  Depression (PHQ2-9): Low Risk (03/01/2024)   Depression (PHQ2-9)    PHQ-2 Score: 3  Alcohol Screen: Not on file  Housing: Unknown (05/21/2023)   Housing Stability Vital Sign    Unable to Pay for Housing in the Last Year: No    Number of Times Moved in the Last Year: Not on file    Homeless in the Last Year: No  Utilities: Not At Risk (05/21/2023)   [employer] Utilities    Threatened with loss of utilities: No  Health Literacy: Not on file   Show/hide medication list[1] Allergies[2]  Immunization History  Administered Date(s) Administered   BCG 02/07/1997   HIB (PRP-T) 03/11/1997, 05/09/1997, 07/29/1997, 11/29/2000   HPV 9-valent 03/01/2024   Hep A, Unspecified 10/18/2002, 01/11/2005   Hep B, Unspecified 11/29/2000, 10/17/2001, 02/26/2002   IPV Nov 15, 1995, 03/11/1997, 05/09/1997, 07/29/1997, 10/17/2001   MMR 01/12/1998, 10/17/2001   Meningococcal Conjugate 10/19/2009   Tdap 10/19/2009, 09/07/2015   Varicella 11/20/2001, 10/19/2009    Health Maintenance  Topic Date Due   Hepatitis C Screening  Never done   HPV VACCINES (2 - 3-dose SCDM series) 03/29/2024   Influenza Vaccine  06/04/2024 (Originally 10/06/2023)   COVID-19 Vaccine (1 - 2025-26 season) 11/04/2024 (Originally 11/06/2023)   Pneumococcal Vaccine (1 of 2 - PCV) 03/01/2025  (Originally 01/10/2016)   DTaP/Tdap/Td (3 - Td or Tdap) 09/06/2025   HIV Screening  Completed   Meningococcal B Vaccine  Aged Out    Patient Care Team: Zaiya Annunziato, Lauraine SAILOR, DO as PCP - General (Family Medicine)       Objective    BP (!) 136/94 (BP Location: Right Arm, Cuff Size: Large)   Pulse 87   Temp 98 F (36.7 C) (Oral)   Ht 6' 1 (1.854 m)   Wt 259 lb 3.2 oz (117.6 kg)   SpO2 100%   BMI 34.20 kg/m     Physical Exam Constitutional:      Appearance: Normal appearance.  HENT:     Head: Normocephalic and atraumatic.  Eyes:  General: No scleral icterus.    Extraocular Movements: Extraocular movements intact.     Conjunctiva/sclera: Conjunctivae normal.  Cardiovascular:     Rate and Rhythm: Normal rate and regular rhythm.     Pulses: Normal pulses.     Heart sounds: Normal heart sounds.  Pulmonary:     Effort: Pulmonary effort is normal. No respiratory distress.     Breath sounds: Wheezing (minimal expiratory in upper lobes) present.  Abdominal:     General: Bowel sounds are normal. There is no distension.     Palpations: Abdomen is soft. There is no mass.     Tenderness: There is no abdominal tenderness. There is no guarding.  Musculoskeletal:     Right lower leg: No edema.     Left lower leg: No edema.  Skin:    General: Skin is warm and dry.  Neurological:     Mental Status: He is alert and oriented to person, place, and time. Mental status is at baseline.  Psychiatric:        Mood and Affect: Mood normal.        Behavior: Behavior normal.      Depression Screen    03/01/2024   10:38 AM  PHQ 2/9 Scores  PHQ - 2 Score 0  PHQ- 9 Score 3   No results found for any visits on 03/01/24.  Assessment & Plan     Moderate persistent asthma without complication Assessment & Plan: Asthma control test score 9 today, indicating poor control.  Exacerbated by allergens, poorly controlled with frequent Primatene use. No insurance (self-pay) limits medication  access.  Previous sinus tachycardia during hospitalization likely stemmed from persistent use of albuterol  to address patient's immediate symptoms of asthma exacerbation. - Prescribed albuterol  inhaler with GoodRx discount, available at [facility] for $33. - Prescribed Advair for daily use, available at [facility] for $56. - Advised nightly Allegra, especially before cat exposure. - Educated on albuterol  as rescue inhaler and Advair for control. - Discussed epinephrine inhaler side effects and importance of prescribed inhalers.   Orders: -     Albuterol  Sulfate HFA; Inhale 2 puffs into the lungs every 6 (six) hours as needed for wheezing or shortness of breath.  Dispense: 36 g; Refill: 6 -     Fluticasone -Salmeterol; Inhale 1 puff into the lungs 2 (two) times daily.  Dispense: 60 each; Refill: 6  Encounter to establish care  Encounter for screening for cardiovascular disorders -     Lipid panel  Encounter for hepatitis C screening test for low risk patient -     HCV Ab w Reflex to Quant PCR  Need for HPV vaccination -     HPV 9-valent vaccine,Recombinat  Elevated blood-pressure reading without diagnosis of hypertension     Encounter to establish care  Elevated blood pressure reading without diagnosis of hypertension Noted.  Possibly related to use of OTC Primatene mist (inhaled epinephrine), in the absence of albuterol  availability.  Prescribed maintenance and rescue inhalers as noted above.  Plan to recheck blood pressure at follow-up visit.  General Health Maintenance; encounter for screening for cardiovascular disorders Eligible for pneumonia vaccine due to asthma; patient declined for today. Declined flu and COVID vaccines. Discussed hepatitis C and lipid screening.  Patient agreeable to first dose of HPV vaccine. - Screened for hepatitis C. - Order lipid panel to screen for cardiovascular disorders - Administer HPV vaccine today.    Return in about 6 months (around 08/30/2024) for  CPE, Asthma, Chronic f/u.  I discussed the assessment and treatment plan with the patient  The patient was provided an opportunity to ask questions and all were answered. The patient agreed with the plan and demonstrated an understanding of the instructions.   The patient was advised to call back or seek an in-person evaluation if the symptoms worsen or if the condition fails to improve as anticipated.    LAURAINE LOISE BUOY, DO  [facility] [phone] (phone) [phone] (fax)  [facility]     [1]  Outpatient Medications Prior to Visit  Medication Sig   [DISCONTINUED] budesonide  (PULMICORT ) 0.5 MG/2ML nebulizer solution Take 2 mLs (0.5 mg total) by nebulization 2 (two) times daily.   [DISCONTINUED] EPINEPHrine (PRIMATENE MIST IN) Inhale 1-2 puffs into the lungs every 4 (four) hours as needed.   [DISCONTINUED] ipratropium-albuterol  (DUONEB) 0.5-2.5 (3) MG/3ML SOLN Take 3 mLs by nebulization every 4 (four) hours as needed.   [DISCONTINUED] methylPREDNISolone  (MEDROL  DOSEPAK) 4 MG TBPK tablet Taper as directed   No facility-administered medications prior to visit.  [2] No Known Allergies  "

## 2024-03-02 LAB — LIPID PANEL
Chol/HDL Ratio: 4.9 ratio (ref 0.0–5.0)
Cholesterol, Total: 170 mg/dL (ref 100–199)
HDL: 35 mg/dL — ABNORMAL LOW
LDL Chol Calc (NIH): 107 mg/dL — ABNORMAL HIGH (ref 0–99)
Triglycerides: 159 mg/dL — ABNORMAL HIGH (ref 0–149)
VLDL Cholesterol Cal: 28 mg/dL (ref 5–40)

## 2024-03-02 LAB — HCV INTERPRETATION

## 2024-03-02 LAB — HCV AB W REFLEX TO QUANT PCR: HCV Ab: NONREACTIVE

## 2024-03-16 ENCOUNTER — Ambulatory Visit: Payer: Self-pay | Admitting: Family Medicine

## 2024-03-29 ENCOUNTER — Ambulatory Visit: Payer: Self-pay | Admitting: Family Medicine

## 2024-08-30 ENCOUNTER — Encounter: Payer: Self-pay | Admitting: Family Medicine
